# Patient Record
Sex: Female | Born: 1950 | Race: White | Hispanic: No | Smoking: Former smoker
Health system: Southern US, Community
[De-identification: ages and names within clinical notes are randomized; demographics above are authoritative.]

## PROBLEM LIST (undated history)

## (undated) DIAGNOSIS — I2699 Other pulmonary embolism without acute cor pulmonale: Secondary | ICD-10-CM

## (undated) DIAGNOSIS — M726 Necrotizing fasciitis: Secondary | ICD-10-CM

## (undated) DIAGNOSIS — D649 Anemia, unspecified: Secondary | ICD-10-CM

## (undated) DIAGNOSIS — J189 Pneumonia, unspecified organism: Secondary | ICD-10-CM

## (undated) DIAGNOSIS — I82409 Acute embolism and thrombosis of unspecified deep veins of unspecified lower extremity: Secondary | ICD-10-CM

## (undated) DIAGNOSIS — N189 Chronic kidney disease, unspecified: Secondary | ICD-10-CM

## (undated) DIAGNOSIS — I509 Heart failure, unspecified: Secondary | ICD-10-CM

## (undated) DIAGNOSIS — R0602 Shortness of breath: Secondary | ICD-10-CM

## (undated) DIAGNOSIS — I429 Cardiomyopathy, unspecified: Secondary | ICD-10-CM

## (undated) DIAGNOSIS — E079 Disorder of thyroid, unspecified: Secondary | ICD-10-CM

## (undated) DIAGNOSIS — I10 Essential (primary) hypertension: Secondary | ICD-10-CM

## (undated) DIAGNOSIS — I4891 Unspecified atrial fibrillation: Secondary | ICD-10-CM

## (undated) HISTORY — DX: Anemia, unspecified: D64.9

## (undated) HISTORY — DX: Heart failure, unspecified: I50.9

## (undated) HISTORY — DX: Chronic kidney disease, unspecified: N18.9

## (undated) HISTORY — DX: Essential (primary) hypertension: I10

## (undated) HISTORY — DX: Necrotizing fasciitis: M72.6

## (undated) HISTORY — DX: Disorder of thyroid, unspecified: E07.9

## (undated) HISTORY — DX: Unspecified atrial fibrillation: I48.91

## (undated) HISTORY — DX: Cardiomyopathy, unspecified: I42.9

## (undated) HISTORY — PX: SALIVARY GLAND SURGERY: SHX768

---

## 2008-01-09 ENCOUNTER — Emergency Department: Payer: Self-pay | Admitting: Emergency Medicine

## 2008-01-11 ENCOUNTER — Emergency Department: Payer: Self-pay | Admitting: Emergency Medicine

## 2008-01-19 ENCOUNTER — Emergency Department: Payer: Self-pay | Admitting: Emergency Medicine

## 2008-01-31 ENCOUNTER — Emergency Department: Payer: Self-pay

## 2008-05-31 DIAGNOSIS — J189 Pneumonia, unspecified organism: Secondary | ICD-10-CM

## 2008-05-31 HISTORY — DX: Pneumonia, unspecified organism: J18.9

## 2009-08-14 ENCOUNTER — Inpatient Hospital Stay: Payer: Self-pay | Admitting: Internal Medicine

## 2010-05-31 DIAGNOSIS — M726 Necrotizing fasciitis: Secondary | ICD-10-CM

## 2010-05-31 HISTORY — DX: Necrotizing fasciitis: M72.6

## 2010-06-26 ENCOUNTER — Inpatient Hospital Stay: Payer: Self-pay | Admitting: Internal Medicine

## 2010-10-30 HISTORY — PX: BACK SURGERY: SHX140

## 2011-02-10 HISTORY — PX: AV FISTULA PLACEMENT: SHX1204

## 2011-05-07 ENCOUNTER — Other Ambulatory Visit: Payer: Self-pay

## 2011-05-07 DIAGNOSIS — T82898A Other specified complication of vascular prosthetic devices, implants and grafts, initial encounter: Secondary | ICD-10-CM

## 2011-06-11 ENCOUNTER — Encounter: Payer: Self-pay | Admitting: Vascular Surgery

## 2011-06-15 ENCOUNTER — Encounter: Payer: Self-pay | Admitting: Vascular Surgery

## 2011-06-16 ENCOUNTER — Ambulatory Visit (INDEPENDENT_AMBULATORY_CARE_PROVIDER_SITE_OTHER): Payer: Medicare Other | Admitting: Vascular Surgery

## 2011-06-16 ENCOUNTER — Other Ambulatory Visit (INDEPENDENT_AMBULATORY_CARE_PROVIDER_SITE_OTHER): Payer: Medicare Other | Admitting: *Deleted

## 2011-06-16 ENCOUNTER — Encounter: Payer: Self-pay | Admitting: Vascular Surgery

## 2011-06-16 VITALS — BP 173/66 | HR 73 | Resp 16 | Ht 64.0 in | Wt 214.0 lb

## 2011-06-16 DIAGNOSIS — N186 End stage renal disease: Secondary | ICD-10-CM | POA: Insufficient documentation

## 2011-06-16 DIAGNOSIS — T82898A Other specified complication of vascular prosthetic devices, implants and grafts, initial encounter: Secondary | ICD-10-CM

## 2011-06-16 NOTE — Progress Notes (Signed)
Vascular and Vein Specialist of [facility]  Patient name: Kelly Gallegos MRN: [id number] DOB: 1950/10/31 Sex: female  REASON FOR CONSULT: evaluate nonfunctioning left AV fistula. Referred by Dr. Darrick Penna.  HPI: Kelly Gallegos is a 61 y.o. female with stage V chronic kidney disease who dialyzes on Tuesdays Thursdays and Saturdays. She had a left brachiocephalic AV fistula placed in [address] in September of 2012. She was referred for with a nonfunctioning fistula. She is currently dialyzed via a Diatek catheter. She's had no recent uremic symptoms. Specifically she denies nausea, vomiting, fatigue, or anorexia.  Past Medical History  Diagnosis Date  . Diabetes mellitus     Type II  . Hypertension   . Atrial fibrillation   . Anemia     Anemia of chronic disease  . Thyroid disease     Secondary Hyperparathyroidism  . Chronic kidney disease   . Necrotizing fasciitis 2012  . CHF (congestive heart failure)     History reviewed. No pertinent family history.  SOCIAL HISTORY: History  Substance Use Topics  . Smoking status: Former Smoker    Quit date: 05/31/2010  . Smokeless tobacco: Not on file  . Alcohol Use: No    Allergies  Allergen Reactions  . Sulfa Antibiotics     Current Outpatient Prescriptions  Medication Sig Dispense Refill  . B Complex-C-Folic Acid (DIALYVITE PO) Take by mouth.      . calcium carbonate (TUMS - DOSED IN MG ELEMENTAL CALCIUM) 500 MG chewable tablet Chew 1 tablet by mouth daily.      . digoxin (LANOXIN) 0.125 MG tablet Take 125 mcg by mouth daily.      Marland Kitchen glipiZIDE (GLUCOTROL XL) 5 MG 24 hr tablet Take 5 mg by mouth daily.      . metoprolol succinate (TOPROL-XL) 25 MG 24 hr tablet Take 25 mg by mouth daily.        REVIEW OF SYSTEMS: Arly.Keller ] denotes positive finding; [  ] denotes negative finding  CARDIOVASCULAR:  [ ]  chest pain   [ ]  chest pressure   [ ]  palpitations   [ ]  orthopnea   [ ]  dyspnea on exertion   [ ]  claudication   [ ]  rest pain   [ ]   DVT   [ ]  phlebitis PULMONARY:   [ ]  productive cough   [ ]  asthma   [ ]  wheezing NEUROLOGIC:   [ ]  weakness  [ ]  paresthesias  [ ]  aphasia  [ ]  amaurosis  [ ]  dizziness HEMATOLOGIC:   [ ]  bleeding problems   [ ]  clotting disorders MUSCULOSKELETAL:  [ ]  joint pain   [ ]  joint swelling [ ]  leg swelling GASTROINTESTINAL: [ ]   blood in stool  [ ]   hematemesis GENITOURINARY:  [ ]   dysuria  [ ]   hematuria PSYCHIATRIC:  [ ]  history of major depression INTEGUMENTARY:  [ ]  rashes  [ ]  ulcers CONSTITUTIONAL:  [ ]  fever   [ ]  chills  PHYSICAL EXAM: Filed Vitals:   06/16/11 1506  BP: 173/66  Pulse: 73  Resp: 16  Height: 5\' 4"  (1.626 m)  Weight: 214 lb (97.07 kg)   Body mass index is 36.73 kg/(m^2). GENERAL: The patient is a well-nourished female, in no acute distress. The vital signs are documented above. CARDIOVASCULAR: There is a regular rate and rhythm without significant murmur appreciated. I do not detect carotid bruits. She has palpable radial and brachial pulses bilaterally. PULMONARY: There is good air exchange bilaterally without wheezing or  rales. ABDOMEN: Soft and non-tender with normal pitched bowel sounds.  MUSCULOSKELETAL: There are no major deformities or cyanosis. SKIN: There are no ulcers or rashes noted. PSYCHIATRIC: The patient has a normal affect.  DATA:  I have independently interpreted her duplex of her fistula. This shows she has an area of decreased vessel size in the proximal fistula with velocities up to 777 cm/s. Fistula is patent. Ammeters range from 0.21 cm to 0.48 cm. The fistula appears to be a right reasonable depth proximally covering the mid brachium is 7 mm in depth.  I did review the operative report from [facility]. The vein was felt to be reasonable size at the time of surgery to  Did independently and duplex the basilic vein in the left arm myself in the office today and this does appear to be fairly small.  MEDICAL ISSUES: I've recommended we proceed  with a fistulogram and hopefully she has a stenosis proximally but could be addressed with venoplasty. The fistula cannot be salvaged been she could be considered for a basilic vein dense position on the left although the vein is fairly small. We could consider doing this in 2 stages. I have discussed the indications for a fistulogram and the potential complications including bleeding or injury to the fistula. All of her questions were answered she is agreeable to proceed as a transportation issues she could not schedule this until 07/05/2011. As a nondialysis day.   Jaycob Mcclenton S [facility] and Vein Specialists of [place] Beeper: [phone]

## 2011-06-22 ENCOUNTER — Other Ambulatory Visit: Payer: Self-pay

## 2011-06-22 ENCOUNTER — Other Ambulatory Visit: Payer: Medicaid Other

## 2011-06-22 ENCOUNTER — Ambulatory Visit: Payer: Medicaid Other | Admitting: Vascular Surgery

## 2011-06-23 NOTE — Procedures (Unsigned)
VASCULAR LAB EXAM  INDICATION:  Nonmaturing AV fistula.  HISTORY: Diabetes: Cardiac: Hypertension:  EXAM:  Left arm AV fistula duplex.  IMPRESSION: 1. Patent left brachiocephalic AV fistula with a velocity of 777 cm/s     noted at the distal brachium level outflow vein.  This increase in     velocity appears to be due to a decreased vessel size. 2. The left radial artery flow appears antegrade. 3. Depth, diameter, velocities, and patent outflow vein branches are     noted on the attached worksheet.  ___________________________________________ Di Kindle. Edilia Bo, M.D.  CH/MEDQ  D:  06/17/2011  T:  06/17/2011  Job:  [id number]

## 2011-07-01 ENCOUNTER — Encounter (HOSPITAL_COMMUNITY): Payer: Self-pay

## 2011-07-04 MED ORDER — SODIUM CHLORIDE 0.9 % IJ SOLN: 3.0000 mL | INTRAMUSCULAR | Status: DC | PRN

## 2011-07-05 ENCOUNTER — Other Ambulatory Visit: Payer: Self-pay

## 2011-07-05 ENCOUNTER — Ambulatory Visit (HOSPITAL_COMMUNITY)
Admission: RE | Admit: 2011-07-05 | Discharge: 2011-07-05 | Disposition: A | Discharge status: Home / Self Care | Source: Ambulatory Visit | Attending: Vascular Surgery | Admitting: Vascular Surgery

## 2011-07-05 ENCOUNTER — Encounter (HOSPITAL_COMMUNITY)
Admission: RE | Disposition: A | Discharge status: Home / Self Care | Payer: Self-pay | Source: Ambulatory Visit | Attending: Vascular Surgery

## 2011-07-05 DIAGNOSIS — T82898A Other specified complication of vascular prosthetic devices, implants and grafts, initial encounter: Secondary | ICD-10-CM

## 2011-07-05 DIAGNOSIS — Y832 Surgical operation with anastomosis, bypass or graft as the cause of abnormal reaction of the patient, or of later complication, without mention of misadventure at the time of the procedure: Secondary | ICD-10-CM | POA: Insufficient documentation

## 2011-07-05 DIAGNOSIS — N189 Chronic kidney disease, unspecified: Secondary | ICD-10-CM | POA: Insufficient documentation

## 2011-07-05 DIAGNOSIS — D638 Anemia in other chronic diseases classified elsewhere: Secondary | ICD-10-CM | POA: Insufficient documentation

## 2011-07-05 DIAGNOSIS — I509 Heart failure, unspecified: Secondary | ICD-10-CM | POA: Insufficient documentation

## 2011-07-05 DIAGNOSIS — N2581 Secondary hyperparathyroidism of renal origin: Secondary | ICD-10-CM | POA: Insufficient documentation

## 2011-07-05 DIAGNOSIS — I4891 Unspecified atrial fibrillation: Secondary | ICD-10-CM | POA: Insufficient documentation

## 2011-07-05 DIAGNOSIS — I129 Hypertensive chronic kidney disease with stage 1 through stage 4 chronic kidney disease, or unspecified chronic kidney disease: Secondary | ICD-10-CM | POA: Insufficient documentation

## 2011-07-05 DIAGNOSIS — E119 Type 2 diabetes mellitus without complications: Secondary | ICD-10-CM | POA: Insufficient documentation

## 2011-07-05 DIAGNOSIS — N186 End stage renal disease: Secondary | ICD-10-CM

## 2011-07-05 HISTORY — PX: FISTULOGRAM: SHX5832

## 2011-07-05 LAB — GLUCOSE, CAPILLARY: Glucose-Capillary: 201 mg/dL — ABNORMAL HIGH (ref 70–99)

## 2011-07-05 SURGERY — FISTULOGRAM
Anesthesia: LOCAL | Laterality: Left

## 2011-07-05 MED ORDER — HEPARIN (PORCINE) IN NACL 2-0.9 UNIT/ML-% IJ SOLN
INTRAMUSCULAR | Status: AC
  Filled 2011-07-05: qty 1000

## 2011-07-05 MED ORDER — LIDOCAINE HCL (PF) 1 % IJ SOLN
INTRAMUSCULAR | Status: AC
  Filled 2011-07-05: qty 30

## 2011-07-05 NOTE — H&P (View-Only) (Signed)
Vascular and Vein Specialist of [place]  Patient name: Kelly Gallegos MRN: [id number] DOB: 62/12/1950 Sex: female  REASON FOR CONSULT: evaluate nonfunctioning left AV fistula. Referred by Dr. Deterding.  HPI: Kelly Gallegos is a 61 y.o. female with stage V chronic kidney disease who dialyzes on Tuesdays Thursdays and Saturdays. She had a left brachiocephalic AV fistula placed in [place] in September of 2012. She was referred for with a nonfunctioning fistula. She is currently dialyzed via a Diatek catheter. She's had no recent uremic symptoms. Specifically she denies nausea, vomiting, fatigue, or anorexia.  Past Medical History  Diagnosis Date  . Diabetes mellitus     Type II  . Hypertension   . Atrial fibrillation   . Anemia     Anemia of chronic disease  . Thyroid disease     Secondary Hyperparathyroidism  . Chronic kidney disease   . Necrotizing fasciitis 2012  . CHF (congestive heart failure)     History reviewed. No pertinent family history.  SOCIAL HISTORY: History  Substance Use Topics  . Smoking status: Former Smoker    Quit date: 05/31/2010  . Smokeless tobacco: Not on file  . Alcohol Use: No    Allergies  Allergen Reactions  . Sulfa Antibiotics     Current Outpatient Prescriptions  Medication Sig Dispense Refill  . B Complex-C-Folic Acid (DIALYVITE PO) Take by mouth.      . calcium carbonate (TUMS - DOSED IN MG ELEMENTAL CALCIUM) 500 MG chewable tablet Chew 1 tablet by mouth daily.      . digoxin (LANOXIN) 0.125 MG tablet Take 125 mcg by mouth daily.      . glipiZIDE (GLUCOTROL XL) 5 MG 24 hr tablet Take 5 mg by mouth daily.      . metoprolol succinate (TOPROL-XL) 25 MG 24 hr tablet Take 25 mg by mouth daily.        REVIEW OF SYSTEMS: [X ] denotes positive finding; [  ] denotes negative finding  CARDIOVASCULAR:  [ ] chest pain   [ ] chest pressure   [ ] palpitations   [ ] orthopnea   [ ] dyspnea on exertion   [ ] claudication   [ ] rest pain   [ ]  DVT   [ ] phlebitis PULMONARY:   [ ] productive cough   [ ] asthma   [ ] wheezing NEUROLOGIC:   [ ] weakness  [ ] paresthesias  [ ] aphasia  [ ] amaurosis  [ ] dizziness HEMATOLOGIC:   [ ] bleeding problems   [ ] clotting disorders MUSCULOSKELETAL:  [ ] joint pain   [ ] joint swelling [ ] leg swelling GASTROINTESTINAL: [ ]  blood in stool  [ ]  hematemesis GENITOURINARY:  [ ]  dysuria  [ ]  hematuria PSYCHIATRIC:  [ ] history of major depression INTEGUMENTARY:  [ ] rashes  [ ] ulcers CONSTITUTIONAL:  [ ] fever   [ ] chills  PHYSICAL EXAM: Filed Vitals:   06/16/11 1506  BP: 173/66  Pulse: 73  Resp: 16  Height: 5' 4" (1.626 m)  Weight: 214 lb (97.07 kg)   Body mass index is 36.73 kg/(m^2). GENERAL: The patient is a well-nourished female, in no acute distress. The vital signs are documented above. CARDIOVASCULAR: There is a regular rate and rhythm without significant murmur appreciated. I do not detect carotid bruits. She has palpable radial and brachial pulses bilaterally. PULMONARY: There is good air exchange bilaterally without wheezing or   rales. ABDOMEN: Soft and non-tender with normal pitched bowel sounds.  MUSCULOSKELETAL: There are no major deformities or cyanosis. SKIN: There are no ulcers or rashes noted. PSYCHIATRIC: The patient has a normal affect.  DATA:  I have independently interpreted her duplex of her fistula. This shows she has an area of decreased vessel size in the proximal fistula with velocities up to 777 cm/s. Fistula is patent. Ammeters range from 0.21 cm to 0.48 cm. The fistula appears to be a right reasonable depth proximally covering the mid brachium is 7 mm in depth.  I did review the operative report from [place]. The vein was felt to be reasonable size at the time of surgery to  Did independently and duplex the basilic vein in the left arm myself in the office today and this does appear to be fairly small.  MEDICAL ISSUES: I've recommended we proceed  with a fistulogram and hopefully she has a stenosis proximally but could be addressed with venoplasty. The fistula cannot be salvaged been she could be considered for a basilic vein dense position on the left although the vein is fairly small. We could consider doing this in 2 stages. I have discussed the indications for a fistulogram and the potential complications including bleeding or injury to the fistula. All of her questions were answered she is agreeable to proceed as a transportation issues she could not schedule this until 07/05/2011. As a nondialysis day.   Kelly Gallegos S V[facility] and Vein Specialists of [place] Beeper: [phone]    

## 2011-07-05 NOTE — Op Note (Signed)
PATIENTBlia Totman   MRN: [id number] DOB: 22-Dec-1950    DATE OF PROCEDURE: 07/05/2011  INDICATIONS: Kelly Gallegos is a 61 y.o. female who had a left brachiocephalic AV fistula placed elsewhere. Duplex suggested a stenosis in the proximal fistula she comes in for a fistulogram.  PROCEDURE:  1. Ultrasound-guided access to left AV fistula. 2. fistulogram left brachiocephalic AV fistula.  SURGEON: Di Kindle. Edilia Bo, MD, FACS  ANESTHESIA: local   EBL: minimal  TECHNIQUE: the patient was brought to the PV lab in the left upper extremity was prepped and draped in the usual sterile fashion. Under ultrasound guidance and after the skin was anesthetized the vein which was very small was cannulated with a micropuncture needle. The wire was introduced I had difficulty in passing the wire because of the small size of the vein. It took multiple attempts before I was able to successfully place the micro-sheath with good return vitals ultimately successful. Fistulogram was then obtained. I was able to visualize the proximal fistula without compressing the distal fistula as I advanced the sheath over a wire into the proximal cephalic vein. At the completion of the procedure the sheath was removed after that a pursestring suture was placed around the entrance site with a Monocryl suture.  FINDINGS: the fistula his widely patent. There is an area of diffuse moderate narrowing throughout the proximal fistula over a length of approximately 3-4 cm. There is one competing branches above this area.  I have recommended surgical revision of the proximal fistula which will be scheduled for a nondialysis day.  Waverly Ferrari, MD, FACS Vascular and Vein Specialists of [facility]  DATE OF DICTATION:   07/05/2011

## 2011-07-05 NOTE — Interval H&P Note (Signed)
History and Physical Interval Note:  07/05/2011 10:05 AM  Kelly Gallegos  has presented today for surgery, with the diagnosis of instage renal disease  The various methods of treatment have been discussed with the patient and family. After consideration of risks, benefits and other options for treatment, the patient has consented to  Procedure(s): FISTULOGRAM  POSSIBLE ANGIOPLASTY.  The patients' history has been reviewed, patient examined, no change in status, stable for surgery.  I have reviewed the patients' chart and labs.  Questions were answered to the patient's satisfaction.     [name]

## 2011-07-06 LAB — POCT I-STAT, CHEM 8
Calcium, Ion: 1.19 mmol/L (ref 1.12–1.32)
HCT: 40 % (ref 36.0–46.0)
Hemoglobin: 13.6 g/dL (ref 12.0–15.0)
Sodium: 137 mEq/L (ref 135–145)
TCO2: 28 mmol/L (ref 0–100)

## 2011-07-15 ENCOUNTER — Other Ambulatory Visit: Payer: Self-pay | Admitting: Family Medicine

## 2011-07-15 ENCOUNTER — Other Ambulatory Visit: Payer: Self-pay

## 2011-07-15 DIAGNOSIS — Z1231 Encounter for screening mammogram for malignant neoplasm of breast: Secondary | ICD-10-CM

## 2011-07-20 ENCOUNTER — Encounter (HOSPITAL_COMMUNITY): Payer: Self-pay | Admitting: Pharmacy Technician

## 2011-07-22 ENCOUNTER — Other Ambulatory Visit: Payer: Self-pay | Admitting: *Deleted

## 2011-07-22 ENCOUNTER — Encounter (HOSPITAL_COMMUNITY): Payer: Self-pay | Admitting: *Deleted

## 2011-07-22 MED ORDER — SODIUM CHLORIDE 0.9 % IV SOLN: INTRAVENOUS | Status: DC

## 2011-07-22 MED ORDER — CEFAZOLIN SODIUM 1-5 GM-% IV SOLN: 1.0000 g | Freq: Once | INTRAVENOUS | Status: DC

## 2011-07-23 ENCOUNTER — Encounter (HOSPITAL_COMMUNITY): Payer: Self-pay

## 2011-07-23 ENCOUNTER — Encounter (HOSPITAL_COMMUNITY): Payer: Self-pay | Admitting: Certified Registered"

## 2011-07-23 ENCOUNTER — Ambulatory Visit (HOSPITAL_COMMUNITY)

## 2011-07-23 ENCOUNTER — Ambulatory Visit (HOSPITAL_COMMUNITY)
Admission: RE | Admit: 2011-07-23 | Discharge: 2011-07-23 | Disposition: A | Discharge status: Home / Self Care | Source: Ambulatory Visit | Attending: Vascular Surgery | Admitting: Vascular Surgery

## 2011-07-23 ENCOUNTER — Ambulatory Visit (HOSPITAL_COMMUNITY): Admitting: Certified Registered"

## 2011-07-23 ENCOUNTER — Encounter (HOSPITAL_COMMUNITY)
Admission: RE | Disposition: A | Discharge status: Home / Self Care | Payer: Self-pay | Source: Ambulatory Visit | Attending: Vascular Surgery

## 2011-07-23 DIAGNOSIS — T82898A Other specified complication of vascular prosthetic devices, implants and grafts, initial encounter: Secondary | ICD-10-CM

## 2011-07-23 DIAGNOSIS — I129 Hypertensive chronic kidney disease with stage 1 through stage 4 chronic kidney disease, or unspecified chronic kidney disease: Secondary | ICD-10-CM | POA: Insufficient documentation

## 2011-07-23 DIAGNOSIS — I4891 Unspecified atrial fibrillation: Secondary | ICD-10-CM | POA: Insufficient documentation

## 2011-07-23 DIAGNOSIS — E119 Type 2 diabetes mellitus without complications: Secondary | ICD-10-CM | POA: Insufficient documentation

## 2011-07-23 DIAGNOSIS — N189 Chronic kidney disease, unspecified: Secondary | ICD-10-CM | POA: Insufficient documentation

## 2011-07-23 DIAGNOSIS — N186 End stage renal disease: Secondary | ICD-10-CM

## 2011-07-23 DIAGNOSIS — D631 Anemia in chronic kidney disease: Secondary | ICD-10-CM | POA: Insufficient documentation

## 2011-07-23 DIAGNOSIS — I509 Heart failure, unspecified: Secondary | ICD-10-CM | POA: Insufficient documentation

## 2011-07-23 DIAGNOSIS — Y849 Medical procedure, unspecified as the cause of abnormal reaction of the patient, or of later complication, without mention of misadventure at the time of the procedure: Secondary | ICD-10-CM | POA: Insufficient documentation

## 2011-07-23 HISTORY — PX: OTHER SURGICAL HISTORY: SHX169

## 2011-07-23 LAB — POCT I-STAT 4, (NA,K, GLUC, HGB,HCT)
Hemoglobin: 12.9 g/dL (ref 12.0–15.0)
Potassium: 3.1 mEq/L — ABNORMAL LOW (ref 3.5–5.1)
Sodium: 141 mEq/L (ref 135–145)

## 2011-07-23 LAB — SURGICAL PCR SCREEN
MRSA, PCR: NEGATIVE
Staphylococcus aureus: NEGATIVE

## 2011-07-23 LAB — GLUCOSE, CAPILLARY: Glucose-Capillary: 189 mg/dL — ABNORMAL HIGH (ref 70–99)

## 2011-07-23 SURGERY — REVISON OF ARTERIOVENOUS FISTULA
Anesthesia: Monitor Anesthesia Care | Site: Arm Upper | Laterality: Left | Wound class: Clean

## 2011-07-23 MED ORDER — OXYCODONE HCL 5 MG PO TABS: 5.0000 mg | ORAL_TABLET | Freq: Four times a day (QID) | ORAL | Status: AC | PRN

## 2011-07-23 MED ORDER — SODIUM CHLORIDE 0.9 % IR SOLN
Status: DC | PRN
  Administered 2011-07-23: 1000 mL

## 2011-07-23 MED ORDER — HEPARIN SODIUM (PORCINE) 1000 UNIT/ML IJ SOLN
1000.0000 [IU] | Freq: Once | INTRAMUSCULAR | Status: AC
  Administered 2011-07-23: 1900 [IU] via INTRAVENOUS

## 2011-07-23 MED ORDER — FENTANYL CITRATE 0.05 MG/ML IJ SOLN
25.0000 ug | INTRAMUSCULAR | Status: DC | PRN
  Administered 2011-07-23: 25 ug via INTRAVENOUS

## 2011-07-23 MED ORDER — DROPERIDOL 2.5 MG/ML IJ SOLN
INTRAMUSCULAR | Status: DC | PRN
  Administered 2011-07-23: 0.625 mg via INTRAVENOUS

## 2011-07-23 MED ORDER — PROTAMINE SULFATE 10 MG/ML IV SOLN
INTRAVENOUS | Status: DC | PRN
  Administered 2011-07-23: 30 mg via INTRAVENOUS

## 2011-07-23 MED ORDER — PROMETHAZINE HCL 25 MG/ML IJ SOLN: 6.2500 mg | INTRAMUSCULAR | Status: DC | PRN

## 2011-07-23 MED ORDER — SODIUM CHLORIDE 0.9 % IR SOLN
Status: DC | PRN
  Administered 2011-07-23: 08:00:00

## 2011-07-23 MED ORDER — MUPIROCIN 2 % EX OINT
TOPICAL_OINTMENT | CUTANEOUS | Status: AC
  Filled 2011-07-23: qty 22

## 2011-07-23 MED ORDER — SODIUM CHLORIDE 0.9 % IV SOLN
INTRAVENOUS | Status: DC | PRN
  Administered 2011-07-23: 07:00:00 via INTRAVENOUS

## 2011-07-23 MED ORDER — LIDOCAINE HCL (CARDIAC) 20 MG/ML IV SOLN
INTRAVENOUS | Status: DC | PRN
  Administered 2011-07-23: 100 mg via INTRAVENOUS

## 2011-07-23 MED ORDER — HEPARIN SODIUM (PORCINE) 1000 UNIT/ML IJ SOLN
INTRAMUSCULAR | Status: DC | PRN
  Administered 2011-07-23: 6000 [IU] via INTRAVENOUS

## 2011-07-23 MED ORDER — PROPOFOL 10 MG/ML IV EMUL
INTRAVENOUS | Status: DC | PRN
  Administered 2011-07-23: 100 ug/kg/min via INTRAVENOUS

## 2011-07-23 MED ORDER — MIDAZOLAM HCL 5 MG/5ML IJ SOLN
INTRAMUSCULAR | Status: DC | PRN
  Administered 2011-07-23: 2 mg via INTRAVENOUS

## 2011-07-23 MED ORDER — LIDOCAINE HCL (PF) 1 % IJ SOLN
INTRAMUSCULAR | Status: DC | PRN
  Administered 2011-07-23: 8 mL

## 2011-07-23 MED ORDER — ONDANSETRON HCL 4 MG/2ML IJ SOLN
INTRAMUSCULAR | Status: DC | PRN
  Administered 2011-07-23: 4 mg via INTRAVENOUS

## 2011-07-23 MED ORDER — CEFAZOLIN SODIUM-DEXTROSE 2-3 GM-% IV SOLR
INTRAVENOUS | Status: AC
  Administered 2011-07-23: 2 g via INTRAVENOUS
  Filled 2011-07-23: qty 50

## 2011-07-23 SURGICAL SUPPLY — 37 items
CANISTER SUCTION 2500CC (MISCELLANEOUS) ×2 IMPLANT
CLIP TI MEDIUM 6 (CLIP) ×2 IMPLANT
CLIP TI WIDE RED SMALL 6 (CLIP) ×2 IMPLANT
CLOTH BEACON ORANGE TIMEOUT ST (SAFETY) ×2 IMPLANT
COVER PROBE W GEL 5X96 (DRAPES) IMPLANT
COVER SURGICAL LIGHT HANDLE (MISCELLANEOUS) ×4 IMPLANT
DECANTER SPIKE VIAL GLASS SM (MISCELLANEOUS) IMPLANT
DERMABOND ADVANCED (GAUZE/BANDAGES/DRESSINGS) ×1
DERMABOND ADVANCED .7 DNX12 (GAUZE/BANDAGES/DRESSINGS) ×1 IMPLANT
DRAIN PENROSE 1/2X12 LTX STRL (WOUND CARE) IMPLANT
ELECT REM PT RETURN 9FT ADLT (ELECTROSURGICAL) ×2
ELECTRODE REM PT RTRN 9FT ADLT (ELECTROSURGICAL) ×1 IMPLANT
GEL ULTRASOUND 20GR AQUASONIC (MISCELLANEOUS) ×2 IMPLANT
GLOVE BIO SURGEON STRL SZ7.5 (GLOVE) ×2 IMPLANT
GLOVE BIOGEL PI IND STRL 7.5 (GLOVE) ×4 IMPLANT
GLOVE BIOGEL PI INDICATOR 7.5 (GLOVE) ×4
GLOVE SS BIOGEL STRL SZ 7 (GLOVE) ×1 IMPLANT
GLOVE SUPERSENSE BIOGEL SZ 7 (GLOVE) ×1
GLOVE SURG SS PI 7.5 STRL IVOR (GLOVE) ×2 IMPLANT
GOWN BRE IMP SLV AUR XL STRL (GOWN DISPOSABLE) ×2 IMPLANT
GOWN STRL NON-REIN LRG LVL3 (GOWN DISPOSABLE) ×4 IMPLANT
KIT BASIN OR (CUSTOM PROCEDURE TRAY) ×2 IMPLANT
KIT ROOM TURNOVER OR (KITS) ×2 IMPLANT
NS IRRIG 1000ML POUR BTL (IV SOLUTION) ×2 IMPLANT
PACK CV ACCESS (CUSTOM PROCEDURE TRAY) ×2 IMPLANT
PAD ARMBOARD 7.5X6 YLW CONV (MISCELLANEOUS) ×4 IMPLANT
PATCH VASCULAR VASCU GUARD 1X6 (Vascular Products) ×2 IMPLANT
SPONGE GAUZE 4X4 12PLY (GAUZE/BANDAGES/DRESSINGS) IMPLANT
SPONGE SURGIFOAM ABS GEL 100 (HEMOSTASIS) IMPLANT
SUT PROLENE 6 0 BV (SUTURE) ×2 IMPLANT
SUT VIC AB 3-0 SH 27 (SUTURE) ×1
SUT VIC AB 3-0 SH 27X BRD (SUTURE) ×1 IMPLANT
SUT VICRYL 4-0 PS2 18IN ABS (SUTURE) ×2 IMPLANT
TOWEL OR 17X24 6PK STRL BLUE (TOWEL DISPOSABLE) ×2 IMPLANT
TOWEL OR 17X26 10 PK STRL BLUE (TOWEL DISPOSABLE) ×2 IMPLANT
UNDERPAD 30X30 INCONTINENT (UNDERPADS AND DIAPERS) ×2 IMPLANT
WATER STERILE IRR 1000ML POUR (IV SOLUTION) ×2 IMPLANT

## 2011-07-23 NOTE — Transfer of Care (Signed)
Immediate Anesthesia Transfer of Care Note  Patient: Kelly Gallegos  Procedure(s) Performed: Procedure(s) (LRB): REVISON OF ARTERIOVENOUS FISTULA (Left) LIGATION OF COMPETING BRANCHES OF ARTERIOVENOUS FISTULA (Left)  Patient Location: PACU  Anesthesia Type: MAC  Level of Consciousness: awake, oriented and patient cooperative  Airway & Oxygen Therapy: Patient Spontanous Breathing  Post-op Assessment: Report given to PACU RN, Post -op Vital signs reviewed and stable and Patient moving all extremities  Post vital signs: Reviewed and stable  Complications: No apparent anesthesia complications

## 2011-07-23 NOTE — Progress Notes (Signed)
Daughter, Thea Silversmith, called & notified that we would be keeping pt until she gets off work@1600 .  She verbalized understanding.//L. LoveRN

## 2011-07-23 NOTE — Progress Notes (Signed)
HD blue port capped off. Flushed with 10cc NS with GBR. Flushed with Heparin 1.78ml (1000u/ml) per priming volume in the blue port.  Cap cleaned, dead end cap placed on the blue port, both clamps taped, red "high dose heparin" sticker placed on the catheter. Consuello Masse

## 2011-07-23 NOTE — Anesthesia Postprocedure Evaluation (Signed)
  Anesthesia Post-op Note  Patient: Kelly Gallegos  Procedure(s) Performed: Procedure(s) (LRB): REVISON OF ARTERIOVENOUS FISTULA (Left) LIGATION OF COMPETING BRANCHES OF ARTERIOVENOUS FISTULA (Left)  Patient Location: PACU  Anesthesia Type: MAC  Level of Consciousness: awake, alert  and oriented  Airway and Oxygen Therapy: Patient Spontanous Breathing and Patient connected to nasal cannula oxygen  Post-op Pain: none  Post-op Assessment: Post-op Vital signs reviewed, Patient's Cardiovascular Status Stable, Respiratory Function Stable, Patent Airway, No signs of Nausea or vomiting and Pain level controlled  Post-op Vital Signs: Reviewed and stable  Complications: No apparent anesthesia complications

## 2011-07-23 NOTE — H&P (Signed)
History and Physical Exam  Patient name: Kelly Gallegos  MRN: [id number]        DOB: 06-Jul-1950          Sex: female  HPI: Kelly Gallegos is a 61 y.o. female with stage V chronic kidney disease who dialyzes on Tuesdays Thursdays and Saturdays. She had a left brachiocephalic AV fistula placed in [place] in September of 2012. She was referred for with a nonfunctioning fistula. She is currently dialyzed via a Diatek catheter. She's had no recent uremic symptoms. Specifically she denies nausea, vomiting, fatigue, or anorexia.    Past Medical History   Diagnosis  Date   .  Diabetes mellitus         Type II   .  Hypertension     .  Atrial fibrillation     .  Anemia         Anemia of chronic disease   .  Thyroid disease         Secondary Hyperparathyroidism   .  Chronic kidney disease     .  Necrotizing fasciitis  2012   .  CHF (congestive heart failure)      History reviewed. No pertinent family history.  SOCIAL HISTORY: History   Substance Use Topics   .  Smoking status:  Former Smoker       Quit date:  05/31/2010   .  Smokeless tobacco:  Not on file   .  Alcohol Use:  No      Allergies   Allergen  Reactions   .  Sulfa Antibiotics      Current Outpatient Prescriptions   Medication  Sig  Dispense  Refill   .  B Complex-C-Folic Acid (DIALYVITE PO)  Take by mouth.         .  calcium carbonate (TUMS - DOSED IN MG ELEMENTAL CALCIUM) 500 MG chewable tablet  Chew 1 tablet by mouth daily.         .  digoxin (LANOXIN) 0.125 MG tablet  Take 125 mcg by mouth daily.         Marland Kitchen  glipiZIDE (GLUCOTROL XL) 5 MG 24 hr tablet  Take 5 mg by mouth daily.         .  metoprolol succinate (TOPROL-XL) 25 MG 24 hr tablet  Take 25 mg by mouth daily.            REVIEW OF SYSTEMS: Arly.Keller ] denotes positive finding; [  ] denotes negative finding   CARDIOVASCULAR:  [ ]  chest pain   [ ]  chest pressure   [ ]  palpitations   [ ]  orthopnea               [ ]  dyspnea on exertion   [ ]  claudication   [ ]  rest  pain   [ ]  DVT   [ ]  phlebitis PULMONARY:   [ ]  productive cough   [ ]  asthma   [ ]  wheezing NEUROLOGIC:   [ ]  weakness  [ ]  paresthesias  [ ]  aphasia  [ ]  amaurosis  [ ]  dizziness HEMATOLOGIC:   [ ]  bleeding problems   [ ]  clotting disorders MUSCULOSKELETAL:  [ ]  joint pain   [ ]  joint swelling [ ]  leg swelling GASTROINTESTINAL: [ ]   blood in stool  [ ]   hematemesis GENITOURINARY:  [ ]   dysuria  [ ]   hematuria PSYCHIATRIC:  [ ]  history of major depression INTEGUMENTARY:  [ ]  rashes  [ ]   ulcers CONSTITUTIONAL:  [ ]  fever   [ ]  chills   PHYSICAL EXAM: Filed Vitals:      Filed Vitals:   07/23/11 0634  BP: 161/75  Pulse: 74  Temp: 98.5 F (36.9 C)  Resp: 18    GENERAL: The patient is a well-nourished female, in no acute distress. The vital signs are documented above. CARDIOVASCULAR: There is a regular rate and rhythm without significant murmur appreciated. I do not detect carotid bruits. She has palpable radial and brachial pulses bilaterally. PULMONARY: There is good air exchange bilaterally without wheezing or rales. ABDOMEN: Soft and non-tender with normal pitched bowel sounds.   MUSCULOSKELETAL: There are no major deformities or cyanosis. SKIN: There are no ulcers or rashes noted. PSYCHIATRIC: The patient has a normal affect.   DATA:   I have independently interpreted her duplex of her fistula. This shows she has an area of decreased vessel size in the proximal fistula with velocities up to 777 cm/s. Fistula is patent. Ammeters range from 0.21 cm to 0.48 cm. The fistula appears to be a right reasonable depth proximally covering the mid brachium is 7 mm in depth.  I did review the operative report from [facility]. The vein was felt to be reasonable size at the time of surgery.  I did independently and duplex the basilic vein in the left arm myself in the office today and this does appear to be fairly small.   MEDICAL ISSUES: Fistulogram suggests narrowing in the proximal  fistula. I have recommended that this be revised surgically. She presents for elective revision of her left AVF.  [name] S [facility] of [place] Beeper: [phone]

## 2011-07-23 NOTE — Op Note (Signed)
NAME: Kelly Gallegos   MRN: [id number] DOB: Aug 23, 1950    DATE OF OPERATION: 07/23/2011  PREOP DIAGNOSIS: poorly maturing left brachiocephalic AV fistula  POSTOP DIAGNOSIS: same  PROCEDURE:  1. Revision of left brachiocephalic fistula with bovine pericardial patch angioplasty of proximal cephalic vein 2. Ligation of competing branch  SURGEON: Di Kindle. Edilia Bo, MD, FACS  ASSIST: Lianne Cure PA  ANESTHESIA: local with sedation   EBL: minimal  INDICATIONS: [name] is a 61 y.o. female had a left brachiocephalic AV fistula placed in [place]. This was not maturing adequately and fistulogram demonstrated a long segment stenosis in the proximal fistula. She was brought in for revision.  FINDINGS: marked intimal hyperplasia of proximal cephalic vein   TECHNIQUE: the patient was taken to the operating room and sedated by anesthesia. The left upper extremity was prepped and draped in the usual sterile fashion. A longitudinal incision was made over the proximal vein after the skin was anesthetized. The cephalic vein was dissected free down to where it was anastomosed to the brachial artery. The dissection was continued up to the competing branching just beyond this. The patient was then heparinized. The competing branches ligated and divided. Did not think there was adequate vein to use as a vein patch and therefore elected to use a bovine pericardial patch. The cephalic vein was clamped proximal and distal to the area of stenosis. The vein was then opened longitudinally. There was marked intimal hyperplasia. The bovine pericardial patch was cut to the appropriate width and then sewn using continuous 6-0 Prolene suture. At the completion the thrill in the fistula was much improved. Hemostasis was obtained in the wound. The wounds closed the deep layer of 3-0 Vicryl the skin closed with 4-0 Vicryl. Dermabond was applied. The patient tolerated the procedure well and was transferred  to recovery in stable condition. All needle and sponge counts were correct.  Waverly Ferrari, MD, FACS [facility] Vein Special[facility] of [facility]  DATE OF DICTATION:   07/23/2011

## 2011-07-23 NOTE — Progress Notes (Signed)
Pt received from PACU.  Alert & Oriented x4.  She stated that she calls for transportation and going home to be taken care of by 62y.o. Granddaughter. Daughter will be home to stay w/ pt @1600 .  Dr. Jean Rosenthal notified. She stated it would be Dr. Adele Dan call.  Dr. Edilia Bo notified.  Dr. Edilia Bo for pt to stay until daughter is home.  Pt notified.  Leandro Reasoner, Short Stay Manager, notified.  Will keep pt until daughter is home.//L. Alira Fretwell,RN

## 2011-07-23 NOTE — Anesthesia Preprocedure Evaluation (Addendum)
Anesthesia Evaluation  Patient identified by MRN, date of birth, ID band Patient awake    Reviewed: Allergy & Precautions, H&P , NPO status , Patient's Chart, lab work & pertinent test results  History of Anesthesia Complications Negative for: history of anesthetic complications  Airway Mallampati: I TM Distance: >3 FB Neck ROM: Full    Dental No notable dental hx. (+) Teeth Intact and Dental Advisory Given   Pulmonary neg pulmonary ROS, former smoker (quit 1 year) clear to auscultation  Pulmonary exam normal       Cardiovascular hypertension, Pt. on medications and Pt. on home beta blockers + dysrhythmias (not on blood thinners) Atrial Fibrillation Irregular Normal    Neuro/Psych Negative Neurological ROS  Negative Psych ROS   GI/Hepatic negative GI ROS, Neg liver ROS,   Endo/Other  Diabetes mellitus- (glu 234), Well Controlled, Oral Hypoglycemic Agents  Renal/GU CRF and DialysisRenal disease (K+ 3.1)  Genitourinary negative   Musculoskeletal negative musculoskeletal ROS (+)   Abdominal (+) obese,   Peds  Hematology negative hematology ROS (+)   Anesthesia Other Findings   Reproductive/Obstetrics negative OB ROS                          Anesthesia Physical Anesthesia Plan  ASA: III  Anesthesia Plan: MAC   Post-op Pain Management:    Induction: Intravenous  Airway Management Planned:   Additional Equipment:   Intra-op Plan:   Post-operative Plan:   Informed Consent: I have reviewed the patients History and Physical, chart, labs and discussed the procedure including the risks, benefits and alternatives for the proposed anesthesia with the patient or authorized representative who has indicated his/her understanding and acceptance.   Dental advisory given  Plan Discussed with: Anesthesiologist, Surgeon and CRNA  Anesthesia Plan Comments: (Plan routine monitors, MAC)        Anesthesia Quick Evaluation

## 2011-07-23 NOTE — Progress Notes (Signed)
Pt noted to have received 500 mls NS w/in 2 hour time. Pt. Denies shortness of breath.  Pt reports that her fluid allotment for the day is 40 oz.  Dr. Edilia Bo notified & no orders received.//l. Gaspard Isbell,rn

## 2011-07-30 ENCOUNTER — Ambulatory Visit
Admission: RE | Admit: 2011-07-30 | Discharge: 2011-07-30 | Disposition: A | Discharge status: Home / Self Care | Payer: No Typology Code available for payment source | Source: Ambulatory Visit | Attending: Family Medicine | Admitting: Family Medicine

## 2011-07-30 DIAGNOSIS — Z1231 Encounter for screening mammogram for malignant neoplasm of breast: Secondary | ICD-10-CM

## 2011-08-20 ENCOUNTER — Other Ambulatory Visit (HOSPITAL_COMMUNITY)
Admission: RE | Admit: 2011-08-20 | Discharge: 2011-08-20 | Disposition: A | Discharge status: Home / Self Care | Source: Ambulatory Visit | Attending: Obstetrics and Gynecology | Admitting: Obstetrics and Gynecology

## 2011-08-20 ENCOUNTER — Other Ambulatory Visit: Payer: Self-pay | Admitting: Obstetrics and Gynecology

## 2011-08-20 DIAGNOSIS — Z124 Encounter for screening for malignant neoplasm of cervix: Secondary | ICD-10-CM | POA: Insufficient documentation

## 2011-08-24 ENCOUNTER — Encounter: Payer: Self-pay | Admitting: Vascular Surgery

## 2011-08-25 ENCOUNTER — Encounter (INDEPENDENT_AMBULATORY_CARE_PROVIDER_SITE_OTHER): Payer: Medicare (Managed Care) | Admitting: *Deleted

## 2011-08-25 ENCOUNTER — Ambulatory Visit (INDEPENDENT_AMBULATORY_CARE_PROVIDER_SITE_OTHER): Payer: Medicare (Managed Care) | Admitting: Vascular Surgery

## 2011-08-25 ENCOUNTER — Encounter: Payer: Self-pay | Admitting: Vascular Surgery

## 2011-08-25 ENCOUNTER — Other Ambulatory Visit: Payer: Self-pay | Admitting: *Deleted

## 2011-08-25 VITALS — BP 118/81 | HR 113 | Resp 18 | Ht 64.0 in | Wt 177.0 lb

## 2011-08-25 DIAGNOSIS — N186 End stage renal disease: Secondary | ICD-10-CM

## 2011-08-25 DIAGNOSIS — Z01810 Encounter for preprocedural cardiovascular examination: Secondary | ICD-10-CM

## 2011-08-25 NOTE — Progress Notes (Signed)
Vascular and Vein Specialist of [facility]  Patient name: Kelly Gallegos MRN: [id number] DOB: [birth year] Sex: female  REASON FOR VISIT: follow up after revision of left brachiocephalic AV fistula.  HPI: Kelly Gallegos is a 61 y.o. female who had a left brachiocephalic AV fistula placed in [place]. This was not maturing adequately in a fistulogram demonstrated a long segment stenosis in the proximal fistula. On 07/23/2011, she underwent revision of her left brachiocephalic fistula with bovine pericardial patch angioplasty of the proximal cephalic vein. She also had ligation of competing branch. She comes in for a follow up visit. She's had no complaints referrable to the fistula and no significant pain.  Past Medical History  Diagnosis Date  . Diabetes mellitus     Type II  . Hypertension   . Atrial fibrillation   . Anemia     Anemia of chronic disease  . Thyroid disease     Secondary Hyperparathyroidism  . Necrotizing fasciitis 2012  . CHF (congestive heart failure)   . Chronic kidney disease     T TH [facility] [address]   SOCIAL HISTORY: History  Substance Use Topics  . Smoking status: Former Smoker    Quit date: 05/31/2010  . Smokeless tobacco: Not on file  . Alcohol Use: No   REVIEW OF SYSTEMS: Arly.Keller ] denotes positive finding; [  ] denotes negative finding  CARDIOVASCULAR:  [ ]  chest pain   [ ]  chest pressure   [ ]  palpitations   [ ]  orthopnea  CONSTITUTIONAL:  [ ]  fever   [ ]  chills  PHYSICAL EXAM: Filed Vitals:   08/25/11 1514  BP: 118/81  Pulse: 113  Resp: 18  Height: 5\' 4"  (1.626 m)  Weight: 177 lb (80.287 kg)   Body mass index is 30.38 kg/(m^2). GENERAL: The patient is a well-nourished female, in no acute distress. The vital signs are documented above. CARDIOVASCULAR: There is a regular rate and rhythm  Her left upper arm fistula has a reasonable thrill although it is still not especially strong. As a palpable left radial pulse.  DATA:  Duplex of her fistula  it shows that there are elevated velocities in the proximal fistula but this is not a focal stenosis and is likely the area that was patched where there was significant intimal hyperplasia. The vein itself is somewhat small with diameters ranging from 0.2-0.48 cm.  We also obtained a vein mapping of her left basilic vein which shows a reasonably sized upper arm basilic vein on the left. Diameters range from 0.25-0.47 cm.  MEDICAL ISSUES: I think it would be reasonable to attempt to use her left brachiocephalic fistula. If this is not functional I do not see any other ways to revise her fistula. Think the next best option would be to attempt a basilic vein transposition on the left. In the meantime she does have a functioning catheter. She'll let us know if the fistula is not functional.  Suraiya Dickerson S [facility] [place]: [phone]

## 2011-09-06 ENCOUNTER — Encounter (HOSPITAL_COMMUNITY): Payer: Self-pay | Admitting: Emergency Medicine

## 2011-09-06 ENCOUNTER — Other Ambulatory Visit: Payer: Self-pay

## 2011-09-06 ENCOUNTER — Emergency Department (HOSPITAL_COMMUNITY): Payer: Medicare (Managed Care)

## 2011-09-06 ENCOUNTER — Inpatient Hospital Stay (HOSPITAL_COMMUNITY)
Admission: EM | Admit: 2011-09-06 | Discharge: 2011-09-08 | DRG: 154 | Disposition: A | Discharge status: Home / Self Care | Payer: Medicare (Managed Care) | Attending: Internal Medicine | Admitting: Internal Medicine

## 2011-09-06 ENCOUNTER — Inpatient Hospital Stay (HOSPITAL_COMMUNITY): Payer: Medicare (Managed Care)

## 2011-09-06 DIAGNOSIS — L03221 Cellulitis of neck: Secondary | ICD-10-CM

## 2011-09-06 DIAGNOSIS — N186 End stage renal disease: Secondary | ICD-10-CM | POA: Diagnosis present

## 2011-09-06 DIAGNOSIS — I482 Chronic atrial fibrillation, unspecified: Secondary | ICD-10-CM | POA: Diagnosis present

## 2011-09-06 DIAGNOSIS — K122 Cellulitis and abscess of mouth: Secondary | ICD-10-CM | POA: Diagnosis present

## 2011-09-06 DIAGNOSIS — Z6830 Body mass index (BMI) 30.0-30.9, adult: Secondary | ICD-10-CM

## 2011-09-06 DIAGNOSIS — L0211 Cutaneous abscess of neck: Secondary | ICD-10-CM | POA: Diagnosis present

## 2011-09-06 DIAGNOSIS — D638 Anemia in other chronic diseases classified elsewhere: Secondary | ICD-10-CM | POA: Diagnosis present

## 2011-09-06 DIAGNOSIS — I4891 Unspecified atrial fibrillation: Secondary | ICD-10-CM

## 2011-09-06 DIAGNOSIS — E119 Type 2 diabetes mellitus without complications: Secondary | ICD-10-CM | POA: Diagnosis present

## 2011-09-06 DIAGNOSIS — Z79899 Other long term (current) drug therapy: Secondary | ICD-10-CM

## 2011-09-06 DIAGNOSIS — N2581 Secondary hyperparathyroidism of renal origin: Secondary | ICD-10-CM | POA: Diagnosis present

## 2011-09-06 DIAGNOSIS — Z882 Allergy status to sulfonamides status: Secondary | ICD-10-CM

## 2011-09-06 DIAGNOSIS — I509 Heart failure, unspecified: Secondary | ICD-10-CM | POA: Diagnosis present

## 2011-09-06 DIAGNOSIS — K1120 Sialoadenitis, unspecified: Principal | ICD-10-CM | POA: Diagnosis present

## 2011-09-06 DIAGNOSIS — Z87891 Personal history of nicotine dependence: Secondary | ICD-10-CM

## 2011-09-06 DIAGNOSIS — I10 Essential (primary) hypertension: Secondary | ICD-10-CM | POA: Diagnosis present

## 2011-09-06 DIAGNOSIS — I120 Hypertensive chronic kidney disease with stage 5 chronic kidney disease or end stage renal disease: Secondary | ICD-10-CM | POA: Diagnosis present

## 2011-09-06 HISTORY — DX: Shortness of breath: R06.02

## 2011-09-06 LAB — BASIC METABOLIC PANEL
CO2: 27 mEq/L (ref 19–32)
Calcium: 9.6 mg/dL (ref 8.4–10.5)
Creatinine, Ser: 2.38 mg/dL — ABNORMAL HIGH (ref 0.50–1.10)
Glucose, Bld: 195 mg/dL — ABNORMAL HIGH (ref 70–99)

## 2011-09-06 LAB — DIFFERENTIAL
Basophils Absolute: 0 10*3/uL (ref 0.0–0.1)
Eosinophils Absolute: 0 10*3/uL (ref 0.0–0.7)
Eosinophils Relative: 0 % (ref 0–5)

## 2011-09-06 LAB — GLUCOSE, CAPILLARY
Glucose-Capillary: 267 mg/dL — ABNORMAL HIGH (ref 70–99)
Glucose-Capillary: 310 mg/dL — ABNORMAL HIGH (ref 70–99)

## 2011-09-06 LAB — MRSA PCR SCREENING: MRSA by PCR: NEGATIVE

## 2011-09-06 LAB — CBC
MCH: 29.1 pg (ref 26.0–34.0)
MCV: 89.7 fL (ref 78.0–100.0)
Platelets: 216 10*3/uL (ref 150–400)
RDW: 15.3 % (ref 11.5–15.5)

## 2011-09-06 MED ORDER — SODIUM CHLORIDE 0.9 % IV SOLN
3.0000 g | Freq: Once | INTRAVENOUS | Status: AC
  Administered 2011-09-06: 3 g via INTRAVENOUS
  Filled 2011-09-06 (×3): qty 3

## 2011-09-06 MED ORDER — METOPROLOL TARTRATE 1 MG/ML IV SOLN
2.5000 mg | Freq: Four times a day (QID) | INTRAVENOUS | Status: DC
  Administered 2011-09-06 – 2011-09-07 (×4): 2.5 mg via INTRAVENOUS
  Filled 2011-09-06 (×7): qty 5

## 2011-09-06 MED ORDER — WARFARIN - PHARMACIST DOSING INPATIENT: Freq: Every day | Status: DC

## 2011-09-06 MED ORDER — SODIUM CHLORIDE 0.9 % IV SOLN
INTRAVENOUS | Status: DC
  Administered 2011-09-06: 20 mL/h via INTRAVENOUS

## 2011-09-06 MED ORDER — VANCOMYCIN HCL IN DEXTROSE 1-5 GM/200ML-% IV SOLN
1000.0000 mg | INTRAVENOUS | Status: DC
  Administered 2011-09-07: 1000 mg via INTRAVENOUS
  Filled 2011-09-06 (×2): qty 200

## 2011-09-06 MED ORDER — MORPHINE SULFATE 2 MG/ML IJ SOLN
0.5000 mg | INTRAMUSCULAR | Status: DC | PRN
  Administered 2011-09-07 (×2): 0.5 mg via INTRAVENOUS
  Filled 2011-09-06: qty 1

## 2011-09-06 MED ORDER — HEPARIN SODIUM (PORCINE) 1000 UNIT/ML DIALYSIS
20.0000 [IU]/kg | INTRAMUSCULAR | Status: DC | PRN
  Administered 2011-09-07: 1700 [IU] via INTRAVENOUS_CENTRAL
  Filled 2011-09-06: qty 2

## 2011-09-06 MED ORDER — SODIUM CHLORIDE 0.9 % IJ SOLN
3.0000 mL | Freq: Two times a day (BID) | INTRAMUSCULAR | Status: DC
  Administered 2011-09-06 – 2011-09-08 (×4): 3 mL via INTRAVENOUS

## 2011-09-06 MED ORDER — DIGOXIN 0.25 MG/ML IJ SOLN
0.1250 mg | INTRAMUSCULAR | Status: DC
  Administered 2011-09-06: 0.5 mg via INTRAVENOUS
  Filled 2011-09-06: qty 2
  Filled 2011-09-06: qty 0.5

## 2011-09-06 MED ORDER — METHYLPREDNISOLONE SODIUM SUCC 125 MG IJ SOLR
125.0000 mg | Freq: Once | INTRAMUSCULAR | Status: AC
  Administered 2011-09-06: 125 mg via INTRAVENOUS
  Filled 2011-09-06: qty 2

## 2011-09-06 MED ORDER — SODIUM CHLORIDE 0.9 % IV SOLN
3.0000 g | INTRAVENOUS | Status: DC
  Administered 2011-09-07: 3 g via INTRAVENOUS
  Filled 2011-09-06 (×3): qty 3

## 2011-09-06 MED ORDER — VANCOMYCIN HCL 1000 MG IV SOLR
1750.0000 mg | Freq: Once | INTRAVENOUS | Status: AC
  Administered 2011-09-06: 1750 mg via INTRAVENOUS
  Filled 2011-09-06: qty 1750

## 2011-09-06 MED ORDER — DIPHENHYDRAMINE HCL 50 MG/ML IJ SOLN
25.0000 mg | Freq: Once | INTRAMUSCULAR | Status: AC
  Administered 2011-09-06: 25 mg via INTRAVENOUS
  Filled 2011-09-06: qty 1

## 2011-09-06 MED ORDER — LIDOCAINE VISCOUS 2 % MT SOLN
20.0000 mL | Freq: Once | OROMUCOSAL | Status: AC
  Administered 2011-09-06: 20 mL via OROMUCOSAL

## 2011-09-06 MED ORDER — IOHEXOL 300 MG/ML  SOLN
75.0000 mL | Freq: Once | INTRAMUSCULAR | Status: AC | PRN
  Administered 2011-09-06: 75 mL via INTRAVENOUS

## 2011-09-06 MED ORDER — INSULIN ASPART 100 UNIT/ML ~~LOC~~ SOLN
0.0000 [IU] | Freq: Every day | SUBCUTANEOUS | Status: DC
  Administered 2011-09-06: 4 [IU] via SUBCUTANEOUS
  Administered 2011-09-07: 2 [IU] via SUBCUTANEOUS

## 2011-09-06 MED ORDER — FAMOTIDINE IN NACL 20-0.9 MG/50ML-% IV SOLN
20.0000 mg | Freq: Once | INTRAVENOUS | Status: AC
  Administered 2011-09-06: 20 mg via INTRAVENOUS
  Filled 2011-09-06: qty 50

## 2011-09-06 MED ORDER — INSULIN ASPART 100 UNIT/ML ~~LOC~~ SOLN
0.0000 [IU] | Freq: Three times a day (TID) | SUBCUTANEOUS | Status: DC
  Administered 2011-09-06: 5 [IU] via SUBCUTANEOUS
  Administered 2011-09-07 (×2): 3 [IU] via SUBCUTANEOUS
  Administered 2011-09-08 (×2): 2 [IU] via SUBCUTANEOUS

## 2011-09-06 MED ORDER — METOPROLOL TARTRATE 1 MG/ML IV SOLN
INTRAVENOUS | Status: AC
  Filled 2011-09-06: qty 5

## 2011-09-06 NOTE — Progress Notes (Signed)
PHARMACY - ANTIBIOTIC CONSULT  INITIAL NOTE  Pharmacy Consult for: Vancomycin, Unasyn  Indication: Ludwig's angina   Patient Data:   Allergies: Allergies  Allergen Reactions  . Sulfa Antibiotics Other (See Comments)    Unknown-childhood allergy    Patient Measurements: Height: 5\' 4"  (162.6 cm) Weight: 177 lb (80.287 kg) (Per 08/25/11 documentation) IBW/kg (Calculated) : 54.7   Vital Signs: Temp:  [98.7 F (37.1 C)] 98.7 F (37.1 C) (04/08 0617) Pulse Rate:  [79-134] 134  (04/08 0617) Resp:  [16-20] 16  (04/08 0617) BP: (148-154)/(84-100) 148/100 mmHg (04/08 0617) SpO2:  [92 %-98 %] 92 % (04/08 0617) Weight:  [177 lb (80.287 kg)] 177 lb (80.287 kg) (04/08 0617)  Intake/Output from previous day: No intake or output data in the 24 hours ending 09/06/11 0640  Labs:  Basename 09/06/11 0319  WBC 13.7*  HGB 12.7  PLT 216  LABCREA --  CREATININE 2.38*   Estimated Creatinine Clearance: 25.1 ml/min (by C-G formula based on Cr of 2.38). No results found for this basename: VANCOTROUGH:2,VANCOPEAK:2,VANCORANDOM:2,GENTTROUGH:2,GENTPEAK:2,GENTRANDOM:2,TOBRATROUGH:2,TOBRAPEAK:2,TOBRARND:2,AMIKACINPEAK:2,AMIKACINTROU:2,AMIKACIN:2, in the last 72 hours   Microbiology: No results found for this or any previous visit (from the past 720 hour(s)).  Medical History: Past Medical History  Diagnosis Date  . Diabetes mellitus     Type II  . Hypertension   . Atrial fibrillation   . Anemia     Anemia of chronic disease  . Thyroid disease     Secondary Hyperparathyroidism  . Necrotizing fasciitis 2012  . CHF (congestive heart failure)   . Chronic kidney disease     T TH [facility] [address]    Scheduled Medications:     . diphenhydrAMINE  25 mg Intravenous Once  . famotidine (PEPCID) IV  20 mg Intravenous Once  . methylPREDNISolone (SOLU-MEDROL) injection  125 mg Intravenous Once      Assessment:  61 y.o. 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 Pharmacy consulted to  manage vancomycin and Unasyn.   Goal of Therapy:  1. Pre-HD vancomycin level 15 - 25 mcg/mL  Plan:  1. Vancomycin 1750mg  IV x 1, then 1gm IV Q-HD (Tues-Thurs-Sat). 2. Unasyn 3gm IV Q24H.    Dineen Kid Thad Ranger, PharmD 09/06/2011, 6:40 AM

## 2011-09-06 NOTE — Consult Note (Signed)
Reason for Consult: Neck swelling Referring Physician: Emergency department  Kelly Gallegos is an 61 y.o. female.  HPI: Several day history of left submandibular swelling and pain. She has a history of having had this in a more mild fashion intermittently for several years. She has not been to a dentist in over 25 years.  Past Medical History  Diagnosis Date  . Diabetes mellitus     Type II  . Hypertension   . Atrial fibrillation   . Anemia     Anemia of chronic disease  . Thyroid disease     Secondary Hyperparathyroidism  . Necrotizing fasciitis 2012  . CHF (congestive heart failure)   . Shortness of breath   . Chronic kidney disease     T TH [facility] [address]    Past Surgical History  Procedure Date  . Av fistula placement 02/10/2011    Left Brachiocephalic Arteriovenous Fistula Formation  . Back surgery 10-2010    pt. 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 Avf revision 07-23-2011    left    Family History  Problem Relation Age of Onset  . Anesthesia problems Neg Hx   . Hypotension Neg Hx   . Malignant hyperthermia Neg Hx   . Pseudochol deficiency Neg Hx     Social History:  reports that she quit smoking about 15 months ago. She does not have any smokeless tobacco history on file. She reports that she does not drink alcohol or use illicit drugs.  Allergies:  Allergies  Allergen Reactions  . Sulfa Antibiotics Other (See Comments)    Unknown-childhood allergy    Medications: I have reviewed the patient's current medications.  Results for orders placed during the hospital encounter of 09/06/11 (from the past 48 hour(s))  BASIC METABOLIC PANEL     Status: Abnormal   Collection Time   09/06/11  3:19 AM      Component Value Range Comment   Sodium 132 (*) 135 - 145 (mEq/L)    Potassium 3.7  3.5 - 5.1 (mEq/L)    Chloride 93 (*) 96 - 112 (mEq/L)    CO2 27  19 - 32 (mEq/L)    Glucose, Bld 195 (*) 70 - 99 (mg/dL)    BUN 30  (*) 6 - 23 (mg/dL)    Creatinine, Ser 4.09 (*) 0.50 - 1.10 (mg/dL)    Calcium 9.6  8.4 - 10.5 (mg/dL)    GFR calc non Af Amer 21 (*) >90 (mL/min)    GFR calc Af Amer 24 (*) >90 (mL/min)   CBC     Status: Abnormal   Collection Time   09/06/11  3:19 AM      Component Value Range Comment   WBC 13.7 (*) 4.0 - 10.5 (K/uL)    RBC 4.36  3.87 - 5.11 (MIL/uL)    Hemoglobin 12.7  12.0 - 15.0 (g/dL)    HCT 81.1  91.4 - 78.2 (%)    MCV 89.7  78.0 - 100.0 (fL)    MCH 29.1  26.0 - 34.0 (pg)    MCHC 32.5  30.0 - 36.0 (g/dL)    RDW 95.6  21.3 - 08.6 (%)    Platelets 216  150 - 400 (K/uL)   DIFFERENTIAL     Status: Abnormal   Collection Time   09/06/11  3:19 AM      Component Value Range Comment   Neutrophils Relative 82 (*) 43 - 77 (%)  Neutro Abs 11.2 (*) 1.7 - 7.7 (K/uL)    Lymphocytes Relative 12  12 - 46 (%)    Lymphs Abs 1.7  0.7 - 4.0 (K/uL)    Monocytes Relative 6  3 - 12 (%)    Monocytes Absolute 0.8  0.1 - 1.0 (K/uL)    Eosinophils Relative 0  0 - 5 (%)    Eosinophils Absolute 0.0  0.0 - 0.7 (K/uL)    Basophils Relative 0  0 - 1 (%)    Basophils Absolute 0.0  0.0 - 0.1 (K/uL)   PROTIME-INR     Status: Abnormal   Collection Time   09/06/11  6:16 AM      Component Value Range Comment   Prothrombin Time 22.0 (*) 11.6 - 15.2 (seconds)    INR 1.89 (*) 0.00 - 1.49    DIGOXIN LEVEL     Status: Abnormal   Collection Time   09/06/11  6:34 AM      Component Value Range Comment   Digoxin Level 0.4 (*) 0.8 - 2.0 (ng/mL)     Ct Soft Tissue Neck W Contrast  09/06/2011  *RADIOLOGY REPORT*  Clinical Data: Left-sided facial swelling; difficulty swallowing.  CT NECK WITH CONTRAST  Technique:  Multidetector CT imaging of the neck was performed with intravenous contrast.  Contrast: 75mL OMNIPAQUE IOHEXOL 300 MG/ML  SOLN  Comparison: None.  Findings: There is significant left-sided soft tissue inflammation, tracking along the floor of the mouth, about the left submandibular gland, and along the upper left  neck.  Associated soft tissue edema extends about the parapharyngeal soft tissues on the left side, involving the left palatine tonsil and parapharyngeal fat planes, and also resulting in some degree of prevertebral soft tissue edema.  Findings are compatible with deep left-sided cellulitis; no abscess is identified at this time.  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  A large calcification is noted at the left floor of the mouth, of uncertain significance; this may reflect a calcified node.  There is significant narrowing of the hypopharynx, due to left- sided mass effect.  The adenoids and right palatine tonsil are unremarkable in appearance.  Borderline enlarged left cervical nodes are seen.  The parotid glands are within normal limits. There is no definite evidence of vascular compromise. Calcification is noted at the carotid bifurcations bilaterally.  Additional soft tissue edema is noted extending along the anterior upper chest.  Small to moderate right and small left pleural effusions are seen, as on the prior chest radiograph.  The visualized superior mediastinum is grossly unremarkable in appearance.  The expanded portions of the lungs are grossly unremarkable.  The visualized paranasal sinuses and mastoid air cells are well- aerated.  The orbits are grossly unremarkable in appearance.  The visualized portions of the brain are unremarkable.  There is chronic absence of multiple maxillary and mandibular teeth.  No acute osseous abnormalities are seen.  There is chronic narrowing of the intervertebral disc space at C5-C6.  IMPRESSION:  1.  No evidence of abscess formation at this time. 2.  Significant left-sided soft tissue inflammation, tracking along the floor of the mouth, about the left submandibular gland, and along the upper left neck, with additional soft tissue inflammation extending along the anterior chest wall.  Prevertebral soft tissue edema also  noted.  Findings compatible with deep left-sided cellulitis.  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 3.  Narrowing of the hypopharynx  due to left-sided mass effect. 4.  Borderline left cervical lymphadenopathy noted. 5.  Large calcification at the left lower the mouth, uncertain significance; this may reflect a calcified node. 6.  Small to moderate right and small left pleural effusions, as on the prior chest radiograph.  These results were called by telephone on 09/06/2011  at  04:20 a.m. to  Dr. Mancel Bale, who verbally acknowledged these results.  Original Report Authenticated By: Tonia Ghent, M.D.    ROS: Otherwise negative  Blood pressure 160/89, pulse 108, temperature 98.4 F (36.9 C), temperature source Oral, resp. rate 17, height 5\' 4"  (1.626 m), weight 177 lb (80.287 kg), SpO2 99.00%.  PHYSICAL EXAM: Overall appearance: Elderly lady, in no distress Head:  Normocephalic, atraumatic. Ears: External auditory canals are clear; tympanic membranes are intact in the middle ears are free of any effusion. Nose: External nose is healthy in appearance. Internal nasal exam free of any lesions or obstruction. Oral Cavity:  There are no mucosal lesions or masses identified. There is significant for dental disease throughout upper and lower. There is a fractured tooth, left lower premolar. There is some surrounding edema and erythema. There is no floor of mouth edema. Oral Pharynx/Hypopharynx/Larynx: no signs of any mucosal lesions or masses identified. Neuro:  No identifiable neurologic deficits. Neck: Left firm mildly tender submandibular swelling.  Studies Reviewed: CT reviewed  Assessment/Plan: Submandibular swelling, most likely from an odontogenic source. Recommend urgent consultation with oral surgery as she likely require require tooth extraction of that left mandibular tooth. There are no airway concerns and there is no evidence of abscess on imaging.  Roxan Yamamoto,  Paelyn Smick 09/06/2011, 1:06 PM

## 2011-09-06 NOTE — H&P (Signed)
IM Attending  55 woman in [facility] program, admitted for severe odontogenic infection of floor of mouth.  Had rapidly progressive swelling of left neck proceeding to dysphagia.  Already better overnight on vanc and unasyn.  No abscess.  ENT has been consulted for advice and contingency care in case of worsening. In [facility] after a 6 m onth admission at [facility] last year for necrotizing fasciitis of gluteal region.  On hemodialysis for ESRD.  Also has CHF. Have visited and examined patient.  Agree with management.

## 2011-09-06 NOTE — ED Notes (Signed)
Pt states that she gets dialysis on Tuesday, Thursdays, and Saturday

## 2011-09-06 NOTE — Consult Note (Signed)
[facility] Renal Consultation Note  Indication for Consultation:  Management of ESRD/hemodialysis; anemia, hypertension/volume and secondary hyperparathyroidism  HPI: Kelly Gallegos is a 61 y.o. female with ESRD on HD at [facility] who presented to the ED last night with worsening left submandibular swelling and pain for the last week.  On 4/2 she first noticed mild swelling, which by 4/6 had become very tender and much larger with fever of 100 and difficulty swallowing secretions.  She contacted her Primary Care Physician who initially provided Augmentin, but recommended hospital evaluation when symptoms worsened.  She is currently feeling better on IV antibiotics since her arrival.    Dialysis Orders: Center: [facility]  on TTS . EDW 80.5 kg   HD Bath 2K/2.25Ca  Time 4 hrs 15 mins   Heparin 2600 U. Access Right IJ catheter  BFR 400 DFR 800    Zemplar 0 mcg IV/HD   Epogen 3000 Units IV/HD  Venofer 50 mg qwk   Past Medical History  Diagnosis Date  . Diabetes mellitus     Type II  . Hypertension   . Atrial fibrillation   . Anemia     Anemia of chronic disease  . Thyroid disease     Secondary Hyperparathyroidism  . Necrotizing fasciitis 2012  . CHF (congestive heart failure)   . Shortness of breath   . Chronic kidney disease     T TH [facility] [facility]   Past Surgical History  Procedure Date  . Av fistula placement 02/10/2011    Left Brachiocephalic Arteriovenous Fistula Formation  . Back surgery 10-2010    pt. 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 Avf revision 07-23-2011    left   Family History  Problem Relation Age of Onset  . Anesthesia problems Neg Hx   . Hypotension Neg Hx   . Malignant hyperthermia Neg Hx   . Pseudochol deficiency Neg Hx   She states that both her mother and sister died of unknown cancer.  Social History She reports that she quit smoking cigarettes on 05/31/10 after a 20-year history of one-half pack a day.  She  occasionally drinks alcohol, but not excessively, and denies any use of illicit drugs.  She previously worked as a Conservation officer, nature and is currently separated and lives with her daughter, one of four kids.  Allergies  Allergen Reactions  . Sulfa Antibiotics Other (See Comments)    Unknown-childhood allergy   Prior to Admission medications   Medication Sig Start Date End Date Taking? Authorizing Provider  amoxicillin-clavulanate (AUGMENTIN) 875-125 MG per tablet Take 1 tablet by mouth 2 (two) times daily.   Yes Historical Provider, MD  calcium carbonate (TUMS - DOSED IN MG ELEMENTAL CALCIUM) 500 MG chewable tablet Chew 1 tablet by mouth 3 (three) times daily before meals. Also before snacks   Yes Historical Provider, MD  digoxin (LANOXIN) 0.125 MG tablet Take 125 mcg by mouth 3 (three) times a week. Monday, Wednesday, Friday   Yes Historical Provider, MD  glipiZIDE (GLUCOTROL) 5 MG tablet Take 5 mg by mouth 2 (two) times daily before a meal.   Yes Historical Provider, MD  metoprolol tartrate (LOPRESSOR) 25 MG tablet Take 25 mg by mouth 2 (two) times daily.   Yes Historical Provider, MD  Multiple Vitamin (MULITIVITAMIN WITH MINERALS) TABS Take 1 tablet by mouth daily.   Yes Historical Provider, MD  warfarin (COUMADIN) 5 MG tablet Take 5 mg by mouth daily. Takes 5  mg 7 days week.   Yes Historical Provider, MD   Labs:  Results for orders placed during the hospital encounter of 09/06/11 (from the past 48 hour(s))  BASIC METABOLIC PANEL     Status: Abnormal   Collection Time   09/06/11  3:19 AM      Component Value Range Comment   Sodium 132 (*) 135 - 145 (mEq/L)    Potassium 3.7  3.5 - 5.1 (mEq/L)    Chloride 93 (*) 96 - 112 (mEq/L)    CO2 27  19 - 32 (mEq/L)    Glucose, Bld 195 (*) 70 - 99 (mg/dL)    BUN 30 (*) 6 - 23 (mg/dL)    Creatinine, Ser 1.61 (*) 0.50 - 1.10 (mg/dL)    Calcium 9.6  8.4 - 10.5 (mg/dL)    GFR calc non Af Amer 21 (*) >90 (mL/min)    GFR calc Af Amer 24 (*) >90 (mL/min)   CBC      Status: Abnormal   Collection Time   09/06/11  3:19 AM      Component Value Range Comment   WBC 13.7 (*) 4.0 - 10.5 (K/uL)    RBC 4.36  3.87 - 5.11 (MIL/uL)    Hemoglobin 12.7  12.0 - 15.0 (g/dL)    HCT 09.6  04.5 - 40.9 (%)    MCV 89.7  78.0 - 100.0 (fL)    MCH 29.1  26.0 - 34.0 (pg)    MCHC 32.5  30.0 - 36.0 (g/dL)    RDW 81.1  91.4 - 78.2 (%)    Platelets 216  150 - 400 (K/uL)   DIFFERENTIAL     Status: Abnormal   Collection Time   09/06/11  3:19 AM      Component Value Range Comment   Neutrophils Relative 82 (*) 43 - 77 (%)    Neutro Abs 11.2 (*) 1.7 - 7.7 (K/uL)    Lymphocytes Relative 12  12 - 46 (%)    Lymphs Abs 1.7  0.7 - 4.0 (K/uL)    Monocytes Relative 6  3 - 12 (%)    Monocytes Absolute 0.8  0.1 - 1.0 (K/uL)    Eosinophils Relative 0  0 - 5 (%)    Eosinophils Absolute 0.0  0.0 - 0.7 (K/uL)    Basophils Relative 0  0 - 1 (%)    Basophils Absolute 0.0  0.0 - 0.1 (K/uL)   PROTIME-INR     Status: Abnormal   Collection Time   09/06/11  6:16 AM      Component Value Range Comment   Prothrombin Time 22.0 (*) 11.6 - 15.2 (seconds)    INR 1.89 (*) 0.00 - 1.49    DIGOXIN LEVEL     Status: Abnormal   Collection Time   09/06/11  6:34 AM      Component Value Range Comment   Digoxin Level 0.4 (*) 0.8 - 2.0 (ng/mL)   GLUCOSE, CAPILLARY     Status: Abnormal   Collection Time   09/06/11  1:18 PM      Component Value Range Comment   Glucose-Capillary 262 (*) 70 - 99 (mg/dL)    Comment 1 Documented in Chart      Comment 2 Notify RN      Constitutional: positive for fever, no chills or fatigue Ears, nose, mouth, throat, and face: positive for submandibular swelling, pain, and dysphagia Respiratory: negative for cough, dyspnea on exertion and wheezing Cardiovascular: negative for chest pain, chest  pressure/discomfort, dyspnea and orthopnea Gastrointestinal: negative for abdominal pain, diarrhea, nausea and vomiting Genitourinary:oliguric Musculoskeletal:negative for arthralgias,  muscle weakness and myalgias Neurological: positive for headaches, but negative for dizziness and weakness  Physical Exam: Filed Vitals:   09/06/11 1215  BP: 151/93  Pulse: 133  Temp:   Resp: 19     General appearance: alert, cooperative and no distress Head: Normocephalic, without obvious abnormality, atraumatic Neck: left submandibular swelling with mild tenderness to palpation Resp: clear to auscultation bilaterally Cardio: irregularly irregular rhythm, tachycardic GI: soft, non-tender; bowel sounds normal; no masses,  no organomegaly Extremities: extremities normal, atraumatic, no cyanosis or edema Neurologic: Grossly normal Dialysis Access: Right IJ catheter, AVF @ [facility] (revised on 2/22)  Assessment/Plan: 1.  Left submandibular swelling - CT indicates left-sided soft tissue inflammation about the submandibular gland, along the neck, and extending along the anterior chest wall, compatible with cellulitis; involvement of the floor of mouth reflects Ludwig's angina; on IV Unasyn and Vancomycin. 2.  ESRD -  On HD on TTS, K stable at 3.7.  Next HD tomorrow. 3.  Hypertension/volume  - BP elevated today, most recently 168/95, on Metoprolol 2.5 mg q6h IV (25 mg bid as outpatient). 4.  Anemia  - Hgb 12.7 on outpatient Epogen and IV Fe (s/p recent IV Fe x 5 Txs). 5.  Metabolic bone disease -  Ca 9.6, no recent P (but usually controlled on Tums Ultra), off Zemplar. 6.  Nutrition - Last Alb 3.7. 7.  Chronic atrial fibrillation - on Coumadin, Digoxin, and BB. 8.  DM - last Hgb A1c 7.3 on 1/24, on Glipizide 5 mg bid since 2/26. 9.  History of necrotizing fasciitis - on back, resolved s/p surgery and wound vac [facility].   LYLES,CHARLES 09/06/2011, 2:08 PM   Attending Nephrologist: Delano Metz, MD  Patient seen and examined and agree with assessment and plan as above.  Vinson Moselle  MD [facility] [phone] pgr    [phone] cell 09/06/2011, 4:03 PM

## 2011-09-06 NOTE — H&P (Signed)
Hospital Admission Note Date: 09/06/2011  Patient name: Kelly Gallegos Medical record number: [id number] Date of birth: [birth year] Age: 61 y.o. Gender: female PCP: Thane Edu, MD, MD  Medical Service:          Internal Medicine Teaching Service    Attending physician:  Dr.  Arnaldo Natal  1st Contact:  Dr. Abner Greenspan     Pager: [phone] 2nd Contact:  Dr. Tonny Branch  Pager: [phone] After 5 pm or weekends: 1st Contact:      Pager: [phone] 2nd Contact:      Pager: [phone]  Chief Complaint:  Chief Complaint  Patient presents with  . Oral Swelling  . Sore Throat     History of Present Illness: Kelly Gallegos is a 61 y.o.female with past medical history significant for afib with RVR, CHF (unknown last ejection fraction), diabetes mellitus (unknown last hemoglobin A1c,) ESRD on HD T, TH, Sat who presents with left sided neck pain and swelling for 5 days.    Kelly Gallegos states that on  Tuesday she noticed some swelling on the left submandibular area. She pointed this out to Dr. Dorise Hiss on Wednesday and he elected to monitor it. However It kept growing and getting more painful and moving medially until yesterday, when she noted difficulty swallowing and fever to 100. She contacted her PCP who suggested that she take Augmentin. However she continued to have pain, swelling, and difficulty swallowing her secretions. She then came to the ED on the recommendation of her PCP. She states that she has a throbbing pain in her left jaw.Her last food intake was yesterday when she noted that it was difficult to open her mouth. She states that she ate chicken soup which was difficult for her to swallow. She states that it is difficult for her to swallow her secretions, and endorsed mild difficulty with respirations, but denied shortness of breath.  Denies, N/v/d, constipation, chest pain, SOB  Review of Systems: Constitutional: Endorses fever HEENT: Endorses baseline blurred  vision, but denies change. Endorses neck pain, swelling, and difficulty swallowing. Denies difficulty hearing.  Respiratory: Denies SOB, DOE, cough, chest tightness,  and wheezing.   endorses mild difficulty breathing. Cardiovascular: Denies chest pain, palpitations and leg swelling.  Gastrointestinal: Denies nausea, vomiting, abdominal pain,  Genitourinary: Denies dysuria, does still urinate Musculoskeletal: Denies myalgias, back pain, joint swelling, arthralgias and gait problem.  Skin: Denies pallor, rash and wound.  Neurological: Denies dizziness, weakness, numbness and headaches.  Hematological: Endorses left-sided neck swelling Psychiatric/Behavioral: Denies Confusion  Past Medical History  Diagnosis Date  . Diabetes mellitus     Type II  . Hypertension   . Atrial fibrillation   . Anemia     Anemia of chronic disease  . Thyroid disease     Secondary Hyperparathyroidism  . Necrotizing fasciitis 2012  . CHF (congestive heart failure)   . Chronic kidney disease     T TH SAT [facility]    Past Surgical History  Procedure Date  . Av fistula placement 02/10/2011    Left Brachiocephalic Arteriovenous Fistula Formation  . Back surgery 10-2010    pt. 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 Avf revision 07-23-2011    left    Meds: Medications Prior to Admission  Medication Dose Route Frequency Provider Last Rate Last Dose  . 0.9 %  sodium chloride infusion   Intravenous Continuous Flint Melter, MD 10 mL/hr at 09/06/11 0505 10 mL/hr  at 09/06/11 0505  . diphenhydrAMINE (BENADRYL) injection 25 mg  25 mg Intravenous Once Flint Melter, MD   25 mg at 09/06/11 0334  . famotidine (PEPCID) IVPB 20 mg  20 mg Intravenous Once Flint Melter, MD   20 mg at 09/06/11 0340  . iohexol (OMNIPAQUE) 300 MG/ML solution 75 mL  75 mL Intravenous Once PRN Medication Radiologist, MD   75 mL at 09/06/11 0407  . methylPREDNISolone sodium succinate  (SOLU-MEDROL) 125 mg/2 mL injection 125 mg  125 mg Intravenous Once Flint Melter, MD   125 mg at 09/06/11 0335   Medications Prior to Admission  Medication Sig Dispense Refill  . Warfarin Sodium (COUMADIN PO) Take by mouth daily.      . B Complex-C-Folic Acid (DIALYVITE PO) Take 1 tablet by mouth daily.       . calcium carbonate (TUMS - DOSED IN MG ELEMENTAL CALCIUM) 500 MG chewable tablet Chew 1 tablet by mouth 3 (three) times daily before meals. Also before snacks      . digoxin (LANOXIN) 0.125 MG tablet Take 125 mcg by mouth 3 (three) times a week. Monday, Wednesday, Friday      . glipiZIDE (GLUCOTROL XL) 5 MG 24 hr tablet Take 5 mg by mouth daily.      . metoprolol tartrate (LOPRESSOR) 25 MG tablet Take 25 mg by mouth 2 (two) times daily.        Allergies: Sulfa antibiotics  Family History  Problem Relation Age of Onset  . Anesthesia problems Neg Hx   . Hypotension Neg Hx   . Malignant hyperthermia Neg Hx   . Pseudochol deficiency Neg Hx     History   Social History  . Marital Status: Single    Spouse Name: N/A    Number of Children: N/A  . Years of Education: N/A   Occupational History  . Not on file.   Social History Main Topics  . Smoking status: Former Smoker    Quit date: 05/31/2010  . Smokeless tobacco: Not on file  . Alcohol Use: No  . Drug Use: No  . Sexually Active: Not on file   Other Topics Concern  . Not on file   Social History Narrative  . 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 Got HF and necrotizing fasciitis which led to a long hospitalization, disability, and she no longer works. Graduated 12 grade.    Physical Exam: Blood pressure 154/100, pulse 133, temperature 98.7 F (37.1 C), temperature source Oral, resp. rate 20, SpO2 92.00%. Gen: Well-developed, well-nourished female  in no acute distress; alert, appropriate and cooperative throughout examination. Head: Normocephalic, atraumatic. Eyes: PERRL, No signs of anemia  or jaundince. Nose: Mucous membranes moist, not inflammed, nonerythematous. Throat: Oropharynx nonerythematous, no exudate appreciated. Patient could only open her mouth approximately 3 cm. Speaking without difficulty.  Neck: Left-sided submandibular mass with swelling extending to the lower left jaw. Mild superficial erythema. The mass is tender to palpation. Lungs: Normal respiratory effort. Clear to auscultation BL, without crackles or wheezes. Heart: Irregularly irregular heart rate with tachycardia. S1 and S2 normal without  murmur, gallop,or rubs. Abdomen: BS normoactive. Soft, nondistended, non-tender. No masses or organomegaly. Extremities: No pretibial edema. Neurologic: No focal neurologic deficits, cranial nerves II through XII grossly intact  Skin: No visible rashes, scars. Psych: mood and affect are normal.    Lab results: Basic Metabolic Panel:  Basename 09/06/11 0319  NA 132*  K 3.7  CL 93*  CO2 27  GLUCOSE 195*  BUN 30*  CREATININE 2.38*  CALCIUM 9.6  MG --  PHOS --   CBC:  Basename 09/06/11 0319  WBC 13.7*  NEUTROABS 11.2*  HGB 12.7  HCT 39.1  MCV 89.7  PLT 216   Imaging results:  Ct Soft Tissue Neck W Contrast  09/06/2011  *RADIOLOGY REPORT*  Clinical Data: Left-sided facial swelling; difficulty swallowing.  CT NECK WITH CONTRAST  Technique:  Multidetector CT imaging of the neck was performed with intravenous contrast.  Contrast: 75mL OMNIPAQUE IOHEXOL 300 MG/ML  SOLN  Comparison: None.  Findings: There is significant left-sided soft tissue inflammation, tracking along the floor of the mouth, about the left submandibular gland, and along the upper left neck.  Associated soft tissue edema extends about the parapharyngeal soft tissues on the left side, involving the left palatine tonsil and parapharyngeal fat planes, and also resulting in some degree of prevertebral soft tissue edema.  Findings are compatible with deep left-sided cellulitis; no abscess is  identified at this time.  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  A large calcification is noted at the left floor of the mouth, of uncertain significance; this may reflect a calcified node.  There is significant narrowing of the hypopharynx, due to left- sided mass effect.  The adenoids and right palatine tonsil are unremarkable in appearance.  Borderline enlarged left cervical nodes are seen.  The parotid glands are within normal limits. There is no definite evidence of vascular compromise. Calcification is noted at the carotid bifurcations bilaterally.  Additional soft tissue edema is noted extending along the anterior upper chest.  Small to moderate right and small left pleural effusions are seen, as on the prior chest radiograph.  The visualized superior mediastinum is grossly unremarkable in appearance.  The expanded portions of the lungs are grossly unremarkable.  The visualized paranasal sinuses and mastoid air cells are well- aerated.  The orbits are grossly unremarkable in appearance.  The visualized portions of the brain are unremarkable.  There is chronic absence of multiple maxillary and mandibular teeth.  No acute osseous abnormalities are seen.  There is chronic narrowing of the intervertebral disc space at C5-C6.  IMPRESSION:  1.  No evidence of abscess formation at this time. 2.  Significant left-sided soft tissue inflammation, tracking along the floor of the mouth, about the left submandibular gland, and along the upper left neck, with additional soft tissue inflammation extending along the anterior chest wall.  Prevertebral soft tissue edema also noted.  Findings compatible with deep left-sided cellulitis.  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 3.  Narrowing of the hypopharynx due to left-sided mass effect. 4.  Borderline left cervical lymphadenopathy noted. 5.  Large calcification at the left lower the mouth, uncertain  significance; this may reflect a calcified node. 6.  Small to moderate right and small left pleural effusions, as on the prior chest radiograph.  These results were called by telephone on 09/06/2011  at  04:20 a.m. to  Dr. Mancel Bale, who verbally acknowledged these results.  Original Report Authenticated By: Tonia Ghent, M.D.      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  The patient will need to be observed closely for any airway compromise. The patient has progressive swelling or dyspnea she  should be intubated. ENT was consulted by the ED physician and will evaluate the patient this morning. Given given the polymicrobial nature of Ludwig's angina, the patient's prior history of necrotizing fasciitis and diabetes and high risk for healthcare associated infection due to her PACE enrollment, the patient will be started on IV vancomycin and Unasyn. Patient will need any oral medications changed to IV formulations due to her difficulty swallowing her secretions. -- Admitted to step down -- We'll follow blood cultures -- IV vancomycin and Unasyn -- Pain control with IV morphine 0.5 mg every 4 hours -- ENT consulted -- Very low threshold for intubation  Active Problems:  2) Chronic a-fib - patient is currently in A. fib with RVR with heart rates to the 130s. She will need rate control and anticoagulation. Since she cannot swallow, we will give her IV relations of her home metoprolol, digoxin, and warfarin at equivalent doses. -- metoprolol at 2.5 mg every 6 hours.  Hold on dialysis days -- Will give IV warfarin per pharmacy. -- Digoxin 0.125 mg every Monday Wednesday Friday  3) End stage renal disease - the patient receives hemodialysis every Tuesday, Thursday, Saturday. Patient will need dialysis while she is admitted. -- Will contact renal team to  have them set up hemodialysis for her  4) DM (diabetes mellitus) - unknown hemoglobin A1c. Will contact PCP regarding medical records.  Will place patient on sliding scale insulin sensitive protocol. Will hold glipizide.  5) CHF (congestive heart failure) - unknown last ejection fraction. We'll contact PCP regarding records. In the meantime will not hydrate, as the patient does not appear volume depleted. Will continue metoprolol as above.  6) Hypertension - patient is slightly hypertensive to 150s over 100s. Will continue metoprolol as above.   7) Anemia of chronic disease - according to her records patient's baseline hemoglobin is approximately 12-13. The patient is currently at 12.7. We'll contact PCP regarding records. We'll make no interventions at this time.  8) DVT prophylaxis - warfarin -- Will check INR  Signed: Yanelie Gallegos 09/06/2011, 5:38 AM

## 2011-09-06 NOTE — ED Notes (Signed)
Pt was seen by ENT

## 2011-09-06 NOTE — ED Notes (Signed)
Attempted to call report but receiving RN is unavailable.

## 2011-09-06 NOTE — ED Notes (Signed)
In to assess pt. She is alert and oriented. Noticed on monitor that pt heart rate 120-140 afib. Pt with known hx of afib. States her normal hr around 60-70. She denies sob or cp.

## 2011-09-06 NOTE — Progress Notes (Signed)
ANTICOAGULATION CONSULT NOTE - Initial Consult  Pharmacy Consult for Coumadin Indication: atrial fibrillation  Allergies  Allergen Reactions  . Sulfa Antibiotics Other (See Comments)    Unknown-childhood allergy    Patient Measurements: Height: 5\' 4"  (162.6 cm) Weight: 177 lb (80.287 kg) (Per 08/25/11 documentation) IBW/kg (Calculated) : 54.7  Vital Signs: Temp: 98.4 F (36.9 C) (04/08 0949) Temp src: Oral (04/08 0949) BP: 151/93 mmHg (04/08 1215) Pulse Rate: 133  (04/08 1215)  Labs:  Alvira Philips 09/06/11 0616 09/06/11 0319  HGB -- 12.7  HCT -- 39.1  PLT -- 216  APTT -- --  LABPROT 22.0* --  INR 1.89* --  HEPARINUNFRC -- --  CREATININE -- 2.38*  CKTOTAL -- --  CKMB -- --  TROPONINI -- --   Estimated Creatinine Clearance: 25.1 ml/min (by C-G formula based on Cr of 2.38).  Medical History: Past Medical History  Diagnosis Date  . Diabetes mellitus     Type II  . Hypertension   . Atrial fibrillation   . Anemia     Anemia of chronic disease  . Thyroid disease     Secondary Hyperparathyroidism  . Necrotizing fasciitis 2012  . CHF (congestive heart failure)   . Shortness of breath   . Chronic kidney disease     T TH SAT [facility]    Medications:  Prescriptions prior to admission  Medication Sig Dispense Refill  . amoxicillin-clavulanate (AUGMENTIN) 875-125 MG per tablet Take 1 tablet by mouth 2 (two) times daily.      . calcium carbonate (TUMS - DOSED IN MG ELEMENTAL CALCIUM) 500 MG chewable tablet Chew 1 tablet by mouth 3 (three) times daily before meals. Also before snacks      . digoxin (LANOXIN) 0.125 MG tablet Take 125 mcg by mouth 3 (three) times a week. Monday, Wednesday, Friday      . glipiZIDE (GLUCOTROL) 5 MG tablet Take 5 mg by mouth 2 (two) times daily before a meal.      . metoprolol tartrate (LOPRESSOR) 25 MG tablet Take 25 mg by mouth 2 (two) times daily.      . Multiple Vitamin (MULITIVITAMIN WITH MINERALS) TABS Take 1 tablet by mouth daily.        Marland Kitchen warfarin (COUMADIN) 5 MG tablet Take 5 mg by mouth daily. Takes 5 mg 7 days week.        Assessment: 61yo female with 5 day history left sided neck pain and swelling- was given Augmentin by outpatient MD.   Anticoagulation: Patient was on Coumadin for Afib PTA. INR today is 1.89. Home regimen was Coumadin 5mg  po daily. Pharmacy has been asked to resume Coumadin. Patient currently has no IV access. CBC is within normal limits except for a mildly elevated WBC being treated by antibiotics per pharmacy (see previous note). Spoke with Internal Medicine MD concerning plan for possible oral surgery per ENT. Dr. Berlinda Last with Internal medicine wishes to hold coumadin until determine surgical plans.   Goal of Therapy:  INR 2-3   Plan:  1. Hold Coumadin per Dr. Berlinda Last until determine surgical plans. 2. Will follow-up with Internal medicine tomorrow. 3. Daily PT/INR.   Link Snuffer, PharmD, BCPS Clinical Pharmacist [phone] 09/06/2011,2:16 PM

## 2011-09-06 NOTE — ED Notes (Signed)
ekg has been performed and shown to md. Pt continues cp free and denies sob. She is on nasal cannula oxygen. Charge nurse made aware of change in pt condition. Will prepare to move pt to pod for closer monitoring.

## 2011-09-06 NOTE — ED Notes (Signed)
Have spoken with teaching service. Medications given as ordered. Pt being moved to pod for further monitoring.

## 2011-09-06 NOTE — ED Notes (Signed)
Admitting MD at bedside.

## 2011-09-06 NOTE — ED Provider Notes (Signed)
History     CSN: [id number]  Arrival date & time 09/06/11  0229   First MD Initiated Contact with Patient 09/06/11 0258      Chief Complaint  Patient presents with  . Oral Swelling  . Sore Throat    (Consider location/radiation/quality/duration/timing/severity/associated sxs/prior treatment) HPI Comments: Kelly Gallegos is a 61 y.o. female with swelling, primarily left neck, for 6 days. She was seen by her PCP 5 days ago, who advised watching the problem. Today, the patient contacted him by phone stating she was worse and he called in a prescription for Augmentin. She has taken 2 doses. She denies fever. She feels like her throat is closing up and has trouble swallowing. She has never had this problem previously. She is not taking an ACE inhibitor. She has been taking her usual medications. She denies fever, chills, nausea, vomiting, chest pain, shortness of breath, back pain, weakness, dizziness. She last dialyzed 2 days ago.  Patient is a 61 y.o. female presenting with pharyngitis. The history is provided by the patient.  Sore Throat    Past Medical History  Diagnosis Date  . Diabetes mellitus     Type II  . Hypertension   . Atrial fibrillation   . Anemia     Anemia of chronic disease  . Thyroid disease     Secondary Hyperparathyroidism  . Necrotizing fasciitis 2012  . CHF (congestive heart failure)   . Chronic kidney disease     T TH [facility]    Past Surgical History  Procedure Date  . Av fistula placement 02/10/2011    Left Brachiocephalic Arteriovenous Fistula Formation  . Back surgery 10-2010    pt. 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 Avf revision 07-23-2011    left    Family History  Problem Relation Age of Onset  . Anesthesia problems Neg Hx   . Hypotension Neg Hx   . Malignant hyperthermia Neg Hx   . Pseudochol deficiency Neg Hx     History  Substance Use Topics  . Smoking status: Former Smoker   Quit date: 05/31/2010  . Smokeless tobacco: Not on file  . Alcohol Use: No    OB History    Grav Para Term Preterm Abortions TAB SAB Ect Mult Living                  Review of Systems  All other systems reviewed and are negative.    Allergies  Sulfa antibiotics  Home Medications   Current Outpatient Rx  Name Route Sig Dispense Refill  . DIALYVITE PO Oral Take 1 tablet by mouth daily.     Marland Kitchen CALCIUM CARBONATE ANTACID 500 MG PO CHEW Oral Chew 1 tablet by mouth 3 (three) times daily before meals. Also before snacks    . DIGOXIN 0.125 MG PO TABS Oral Take 125 mcg by mouth 3 (three) times a week. Monday, Wednesday, Friday    . GLIPIZIDE ER 5 MG PO TB24 Oral Take 5 mg by mouth daily.    Marland Kitchen METOPROLOL TARTRATE 25 MG PO TABS Oral Take 25 mg by mouth 2 (two) times daily.    Marland Kitchen COUMADIN PO Oral Take by mouth daily.      BP 154/100  Pulse 133  Temp(Src) 98.7 F (37.1 C) (Oral)  Resp 20  SpO2 92%  Physical Exam  Nursing note and vitals reviewed. Constitutional: She is oriented to person, place, and time. She  appears well-developed and well-nourished.  HENT:  Head: Normocephalic and atraumatic.       There is no tongue swelling. There is indistinct edema sublingual. There is no pharyngeal redness or swelling. The airway is intact.   Eyes: Conjunctivae and EOM are normal. Pupils are equal, round, and reactive to light.  Neck: Normal range of motion and phonation normal. Neck supple.       There is a mass beneath her left mandible, firm, approximately 4 x 4 centimeters.  The mass is not mobile. There is mild. Associated diffuse swelling of the left side of the neck. No stridor.  Cardiovascular: Normal rate, regular rhythm and intact distal pulses.   Pulmonary/Chest: Effort normal and breath sounds normal. No respiratory distress. She has no wheezes. She has no rales. She exhibits no tenderness.       Dialysis catheter right upper chest wall, site appears normal  Abdominal: Soft. She  exhibits no distension. There is no tenderness. There is no guarding.  Musculoskeletal: Normal range of motion.  Neurological: She is alert and oriented to person, place, and time. She has normal strength. She exhibits normal muscle tone.  Skin: Skin is warm and dry.  Psychiatric: She has a normal mood and affect. Her behavior is normal. Judgment and thought content normal.    ED Course  Procedures (including critical care time)  05:00 Reevaluation with update and discussion. After initial assessment and treatment, an updated evaluation reveals Pt is resting comfortably. No Stridor.Repeat vitals are reassuring. I discussed the case with Dr Modena Jansky, who would like [facility] to admit. They will see pt and write for ABX. They asked me to  Contact ENT- Dr Pollyann Kennedy apprised of findings, and will see pt later today, he agrees that medicine can admit.Mancel Bale L    Labs Reviewed  BASIC METABOLIC PANEL - Abnormal; Notable for the following:    Sodium 132 (*)    Chloride 93 (*)    Glucose, Bld 195 (*)    BUN 30 (*)    Creatinine, Ser 2.38 (*)    GFR calc non Af Amer 21 (*)    GFR calc Af Amer 24 (*)    All other components within normal limits  CBC - Abnormal; Notable for the following:    WBC 13.7 (*)    All other components within normal limits  DIFFERENTIAL - Abnormal; Notable for the following:    Neutrophils Relative 82 (*)    Neutro Abs 11.2 (*)    All other components within normal limits  CULTURE, BLOOD (ROUTINE X 2)  CULTURE, BLOOD (ROUTINE X 2)   Ct Soft Tissue Neck W Contrast  09/06/2011  *RADIOLOGY REPORT*  Clinical Data: Left-sided facial swelling; difficulty swallowing.  CT NECK WITH CONTRAST  Technique:  Multidetector CT imaging of the neck was performed with intravenous contrast.  Contrast: 75mL OMNIPAQUE IOHEXOL 300 MG/ML  SOLN  Comparison: None.  Findings: There is significant left-sided soft tissue inflammation, tracking along the floor of the mouth, about the left  submandibular gland, and along the upper left neck.  Associated soft tissue edema extends about the parapharyngeal soft tissues on the left side, involving the left palatine tonsil and parapharyngeal fat planes, and also resulting in some degree of prevertebral soft tissue edema.  Findings are compatible with deep left-sided cellulitis; no abscess is identified at this time.  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  A large calcification is noted at the  left floor of the mouth, of uncertain significance; this may reflect a calcified node.  There is significant narrowing of the hypopharynx, due to left- sided mass effect.  The adenoids and right palatine tonsil are unremarkable in appearance.  Borderline enlarged left cervical nodes are seen.  The parotid glands are within normal limits. There is no definite evidence of vascular compromise. Calcification is noted at the carotid bifurcations bilaterally.  Additional soft tissue edema is noted extending along the anterior upper chest.  Small to moderate right and small left pleural effusions are seen, as on the prior chest radiograph.  The visualized superior mediastinum is grossly unremarkable in appearance.  The expanded portions of the lungs are grossly unremarkable.  The visualized paranasal sinuses and mastoid air cells are well- aerated.  The orbits are grossly unremarkable in appearance.  The visualized portions of the brain are unremarkable.  There is chronic absence of multiple maxillary and mandibular teeth.  No acute osseous abnormalities are seen.  There is chronic narrowing of the intervertebral disc space at C5-C6.  IMPRESSION:  1.  No evidence of abscess formation at this time. 2.  Significant left-sided soft tissue inflammation, tracking along the floor of the mouth, about the left submandibular gland, and along the upper left neck, with additional soft tissue inflammation extending along the anterior chest  wall.  Prevertebral soft tissue edema also noted.  Findings compatible with deep left-sided cellulitis.  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 3.  Narrowing of the hypopharynx due to left-sided mass effect. 4.  Borderline left cervical lymphadenopathy noted. 5.  Large calcification at the left lower the mouth, uncertain significance; this may reflect a calcified node. 6.  Small to moderate right and small left pleural effusions, as on the prior chest radiograph.  These results were called by telephone on 09/06/2011  at  04:20 a.m. to  Dr. Mancel Bale, who verbally acknowledged these results.  Original Report Authenticated By: Tonia Ghent, M.D.     1. Cellulitis of neck       MDM  Neck cellulitis, source unclear; unlikely to be associated with her dialysis catheter. Patient is currently stable without evidence for abscess or in immediate danger of airway compromise. She'll need to be admitted for parenteral antibiotics, close observation and consultation with ENT. Her renal status is stable at this time.  Plan: Admit- Admit to medicine         Flint Melter, MD 09/06/11 [phone]

## 2011-09-06 NOTE — ED Notes (Signed)
Called teaching service. Spoke with dr Loistine Chance. She is aware pt hr now 140-147. Advises to just give the metoprolol. Restated pt hr and that is her only order

## 2011-09-06 NOTE — ED Notes (Signed)
Wentz MD at bedside. 

## 2011-09-06 NOTE — ED Notes (Signed)
Pt received from cdu [id number] able to move all extremities await ready step down bed pt on monitor and o2 at 2l [place]

## 2011-09-06 NOTE — ED Notes (Signed)
MD at bedside. 

## 2011-09-06 NOTE — ED Notes (Signed)
Internal medicine came to see and examine the patient

## 2011-09-06 NOTE — ED Notes (Signed)
C/o sore throat and throat swelling since Wednesday.  Pt states she was seen by PCP on Wednesday and was told glands were swollen.  Called PCP office on Sunday and had antibiotic called in.

## 2011-09-07 ENCOUNTER — Inpatient Hospital Stay (HOSPITAL_COMMUNITY): Payer: Medicare (Managed Care)

## 2011-09-07 LAB — RENAL FUNCTION PANEL
Albumin: 3.2 g/dL — ABNORMAL LOW (ref 3.5–5.2)
Chloride: 93 mEq/L — ABNORMAL LOW (ref 96–112)
Creatinine, Ser: 3.36 mg/dL — ABNORMAL HIGH (ref 0.50–1.10)
GFR calc non Af Amer: 14 mL/min — ABNORMAL LOW (ref >90)
Potassium: 4.3 mEq/L (ref 3.5–5.1)

## 2011-09-07 LAB — CBC
HCT: 37.4 % (ref 36.0–46.0)
MCH: 28.4 pg (ref 26.0–34.0)
MCHC: 31.3 g/dL (ref 30.0–36.0)
MCV: 90.8 fL (ref 78.0–100.0)
Platelets: 257 10*3/uL (ref 150–400)
RDW: 15.5 % (ref 11.5–15.5)
WBC: 12.3 10*3/uL — ABNORMAL HIGH (ref 4.0–10.5)

## 2011-09-07 LAB — GLUCOSE, CAPILLARY
Glucose-Capillary: 112 mg/dL — ABNORMAL HIGH (ref 70–99)
Glucose-Capillary: 214 mg/dL — ABNORMAL HIGH (ref 70–99)
Glucose-Capillary: 213 mg/dL — ABNORMAL HIGH (ref 70–99)

## 2011-09-07 LAB — HEMOGLOBIN A1C: Mean Plasma Glucose: 160 mg/dL — ABNORMAL HIGH (ref <117)

## 2011-09-07 MED ORDER — SODIUM CHLORIDE 0.9 % IV SOLN: 125.0000 mg | INTRAVENOUS | Status: DC

## 2011-09-07 MED ORDER — INSULIN GLARGINE 100 UNIT/ML ~~LOC~~ SOLN
15.0000 [IU] | Freq: Every day | SUBCUTANEOUS | Status: DC
  Administered 2011-09-07: 15 [IU] via SUBCUTANEOUS

## 2011-09-07 MED ORDER — DIGOXIN 125 MCG PO TABS
0.1250 mg | ORAL_TABLET | ORAL | Status: DC
  Administered 2011-09-08: 0.125 mg via ORAL
  Filled 2011-09-07: qty 1

## 2011-09-07 MED ORDER — METOPROLOL TARTRATE 25 MG PO TABS
25.0000 mg | ORAL_TABLET | Freq: Two times a day (BID) | ORAL | Status: DC
  Administered 2011-09-07 – 2011-09-08 (×2): 25 mg via ORAL
  Filled 2011-09-07 (×4): qty 1

## 2011-09-07 MED ORDER — MORPHINE SULFATE 2 MG/ML IJ SOLN
INTRAMUSCULAR | Status: AC
  Administered 2011-09-07: 0.5 mg via INTRAVENOUS
  Filled 2011-09-07: qty 1

## 2011-09-07 MED ORDER — ACETAMINOPHEN 325 MG PO TABS
650.0000 mg | ORAL_TABLET | Freq: Four times a day (QID) | ORAL | Status: DC | PRN
  Administered 2011-09-07 – 2011-09-08 (×4): 650 mg via ORAL
  Filled 2011-09-07 (×4): qty 2

## 2011-09-07 MED ORDER — DARBEPOETIN ALFA-POLYSORBATE 25 MCG/0.42ML IJ SOLN: 25.0000 ug | INTRAMUSCULAR | Status: DC

## 2011-09-07 NOTE — Consult Note (Signed)
Reason for Consult:left mandibular/neck swelling Referring Physician: Berlinda Last, MD  Kelly Gallegos is an 61 y.o. female.  CC: Left neck swelling.   HPI: Patient relates one week history of swelling left jaw/neck swelling. Gradually increased in size until admission. Caused difficulty swallowing. Getting better since IV antibiotics began in hospital.  Denies tooth pain.   Past Medical History  Diagnosis Date  . Diabetes mellitus     Type II  . Hypertension   . Atrial fibrillation   . Anemia     Anemia of chronic disease  . Thyroid disease     Secondary Hyperparathyroidism  . Necrotizing fasciitis 2012  . CHF (congestive heart failure)   . Shortness of breath   . Chronic kidney disease     T [facility]     Past Surgical History  Procedure Date  . Av fistula placement 02/10/2011    Left Brachiocephalic Arteriovenous Fistula Formation  . Back surgery 10-2010    pt. 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 Avf revision 07-23-2011    left    Family History  Problem Relation Age of Onset  . Anesthesia problems Neg Hx   . Hypotension Neg Hx   . Malignant hyperthermia Neg Hx   . Pseudochol deficiency Neg Hx     Social History:  reports that she quit smoking about 15 months ago. She does not have any smokeless tobacco history on file. She reports that she does not drink alcohol or use illicit drugs.  Allergies:  Allergies  Allergen Reactions  . Sulfa Antibiotics Other (See Comments)    Unknown-childhood allergy    Medications: I have reviewed the patient's current medications.  Results for orders placed during the hospital encounter of 09/06/11 (from the past 48 hour(s))  BASIC METABOLIC PANEL     Status: Abnormal   Collection Time   09/06/11  3:19 AM      Component Value Range Comment   Sodium 132 (*) 135 - 145 (mEq/L)    Potassium 3.7  3.5 - 5.1 (mEq/L)    Chloride 93 (*) 96 - 112 (mEq/L)    CO2 27  19 - 32 (mEq/L)    Glucose, Bld 195 (*) 70 - 99 (mg/dL)    BUN 30 (*) 6 - 23 (mg/dL)    Creatinine, Ser 1.61 (*) 0.50 - 1.10 (mg/dL)    Calcium 9.6  8.4 - 10.5 (mg/dL)    GFR calc non Af Amer 21 (*) >90 (mL/min)    GFR calc Af Amer 24 (*) >90 (mL/min)   CBC     Status: Abnormal   Collection Time   09/06/11  3:19 AM      Component Value Range Comment   WBC 13.7 (*) 4.0 - 10.5 (K/uL)    RBC 4.36  3.87 - 5.11 (MIL/uL)    Hemoglobin 12.7  12.0 - 15.0 (g/dL)    HCT 09.6  04.5 - 40.9 (%)    MCV 89.7  78.0 - 100.0 (fL)    MCH 29.1  26.0 - 34.0 (pg)    MCHC 32.5  30.0 - 36.0 (g/dL)    RDW 81.1  91.4 - 78.2 (%)    Platelets 216  150 - 400 (K/uL)   DIFFERENTIAL     Status: Abnormal   Collection Time   09/06/11  3:19 AM      Component Value Range Comment   Neutrophils Relative 82 (*) 43 - 77 (%)  Neutro Abs 11.2 (*) 1.7 - 7.7 (K/uL)    Lymphocytes Relative 12  12 - 46 (%)    Lymphs Abs 1.7  0.7 - 4.0 (K/uL)    Monocytes Relative 6  3 - 12 (%)    Monocytes Absolute 0.8  0.1 - 1.0 (K/uL)    Eosinophils Relative 0  0 - 5 (%)    Eosinophils Absolute 0.0  0.0 - 0.7 (K/uL)    Basophils Relative 0  0 - 1 (%)    Basophils Absolute 0.0  0.0 - 0.1 (K/uL)   CULTURE, BLOOD (ROUTINE X 2)     Status: Normal (Preliminary result)   Collection Time   09/06/11  5:00 AM      Component Value Range Comment   Specimen Description BLOOD RIGHT HAND      Special Requests THUMB AREA BOTTLES DRAWN AEROBIC AND ANAEROBIC 5CC      Culture  Setup Time [id number]      Culture        Value:        BLOOD CULTURE RECEIVED NO GROWTH TO DATE CULTURE WILL BE HELD FOR 5 DAYS BEFORE ISSUING A FINAL NEGATIVE REPORT   Report Status PENDING     CULTURE, BLOOD (ROUTINE X 2)     Status: Normal (Preliminary result)   Collection Time   09/06/11  5:10 AM      Component Value Range Comment   Specimen Description BLOOD RIGHT HAND      Special Requests TOP BOTTLES DRAWN AEROBIC ONLY 6CC      Culture  Setup Time [id number]      Culture         Value:        BLOOD CULTURE RECEIVED NO GROWTH TO DATE CULTURE WILL BE HELD FOR 5 DAYS BEFORE ISSUING A FINAL NEGATIVE REPORT   Report Status PENDING     PROTIME-INR     Status: Abnormal   Collection Time   09/06/11  6:16 AM      Component Value Range Comment   Prothrombin Time 22.0 (*) 11.6 - 15.2 (seconds)    INR 1.89 (*) 0.00 - 1.49    DIGOXIN LEVEL     Status: Abnormal   Collection Time   09/06/11  6:34 AM      Component Value Range Comment   Digoxin Level 0.4 (*) 0.8 - 2.0 (ng/mL)   GLUCOSE, CAPILLARY     Status: Abnormal   Collection Time   09/06/11  1:18 PM      Component Value Range Comment   Glucose-Capillary 262 (*) 70 - 99 (mg/dL)    Comment 1 Documented in Chart      Comment 2 Notify RN     MRSA PCR SCREENING     Status: Normal   Collection Time   09/06/11  1:58 PM      Component Value Range Comment   MRSA by PCR NEGATIVE  NEGATIVE    GLUCOSE, CAPILLARY     Status: Abnormal   Collection Time   09/06/11  4:49 PM      Component Value Range Comment   Glucose-Capillary 267 (*) 70 - 99 (mg/dL)    Comment 1 Notify RN      Comment 2 Documented in Chart     GLUCOSE, CAPILLARY     Status: Abnormal   Collection Time   09/06/11  9:38 PM      Component Value Range Comment   Glucose-Capillary 310 (*) 70 - 99 (  mg/dL)    Comment 1 Documented in Chart      Comment 2 Notify RN     GLUCOSE, CAPILLARY     Status: Abnormal   Collection Time   09/07/11  7:56 AM      Component Value Range Comment   Glucose-Capillary 216 (*) 70 - 99 (mg/dL)    Comment 1 Notify RN     RENAL FUNCTION PANEL     Status: Abnormal   Collection Time   09/07/11  9:03 AM      Component Value Range Comment   Sodium 131 (*) 135 - 145 (mEq/L)    Potassium 4.3  3.5 - 5.1 (mEq/L)    Chloride 93 (*) 96 - 112 (mEq/L)    CO2 24  19 - 32 (mEq/L)    Glucose, Bld 195 (*) 70 - 99 (mg/dL)    BUN 54 (*) 6 - 23 (mg/dL)    Creatinine, Ser 4.09 (*) 0.50 - 1.10 (mg/dL)    Calcium 9.5  8.4 - 10.5 (mg/dL)    Phosphorus 6.0 (*) 2.3  - 4.6 (mg/dL)    Albumin 3.2 (*) 3.5 - 5.2 (g/dL)    GFR calc non Af Amer 14 (*) >90 (mL/min)    GFR calc Af Amer 16 (*) >90 (mL/min)   CBC     Status: Abnormal   Collection Time   09/07/11  9:03 AM      Component Value Range Comment   WBC 12.3 (*) 4.0 - 10.5 (K/uL)    RBC 4.12  3.87 - 5.11 (MIL/uL)    Hemoglobin 11.7 (*) 12.0 - 15.0 (g/dL)    HCT 81.1  91.4 - 78.2 (%)    MCV 90.8  78.0 - 100.0 (fL)    MCH 28.4  26.0 - 34.0 (pg)    MCHC 31.3  30.0 - 36.0 (g/dL)    RDW 95.6  21.3 - 08.6 (%)    Platelets 257  150 - 400 (K/uL)   PROTIME-INR     Status: Abnormal   Collection Time   09/07/11  9:03 AM      Component Value Range Comment   Prothrombin Time 25.9 (*) 11.6 - 15.2 (seconds)    INR 2.32 (*) 0.00 - 1.49    GLUCOSE, CAPILLARY     Status: Abnormal   Collection Time   09/07/11  2:04 PM      Component Value Range Comment   Glucose-Capillary 112 (*) 70 - 99 (mg/dL)    Comment 1 Documented in Chart      Comment 2 Notify RN       Dg Orthopantogram  09/06/2011  *RADIOLOGY REPORT*  Clinical Data: Left jaw and facial pain.  ORTHOPANTOGRAM/PANORAMIC  Comparison: CT neck earlier in the day  Findings: Large ovoid  calcification on the left could represent a lymph node or submandibular gland calculus. Dental caries, especially left mandibular and left maxillary premolar/molars. Fragmentary change of what appears to be a right mandibular second or third molar of uncertain significance.    No facial fracture.  IMPRESSION: Mandible intact.  Advanced dental disease.  Large ovoid calcification redemonstrated.  Original Report Authenticated By: Elsie Stain, Gallegos.D.   Dg Chest 2 View  09/06/2011  *RADIOLOGY REPORT*  Clinical Data: Jaw and facial pain.  Shortness of breath. Hypertension and diabetes.  CHEST - 2 VIEW  Comparison: 07/23/2011  Findings: Cardiomegaly. Right greater than left pleural effusions with basilar atelectasis.  Dialysis catheter unchanged. No infiltrates or gross failure.  Moderate  vascular congestion.  Bones unremarkable.  IMPRESSION: Cardiomegaly with moderate vascular congestion and right greater than left pleural effusions.  Slight worsening aeration compared with February.  Original Report Authenticated By: Elsie Stain, Gallegos.D.   Ct Soft Tissue Neck W Contrast  09/06/2011  *RADIOLOGY REPORT*  Clinical Data: Left-sided facial swelling; difficulty swallowing.  CT NECK WITH CONTRAST  Technique:  Multidetector CT imaging of the neck was performed with intravenous contrast.  Contrast: 75mL OMNIPAQUE IOHEXOL 300 MG/ML  SOLN  Comparison: None.  Findings: There is significant left-sided soft tissue inflammation, tracking along the floor of the mouth, about the left submandibular gland, and along the upper left neck.  Associated soft tissue edema extends about the parapharyngeal soft tissues on the left side, involving the left palatine tonsil and parapharyngeal fat planes, and also resulting in some degree of prevertebral soft tissue edema.  Findings are compatible with deep left-sided cellulitis; no abscess is identified at this time.  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  A large calcification is noted at the left floor of the mouth, of uncertain significance; this may reflect a calcified node.  There is significant narrowing of the hypopharynx, due to left- sided mass effect.  The adenoids and right palatine tonsil are unremarkable in appearance.  Borderline enlarged left cervical nodes are seen.  The parotid glands are within normal limits. There is no definite evidence of vascular compromise. Calcification is noted at the carotid bifurcations bilaterally.  Additional soft tissue edema is noted extending along the anterior upper chest.  Small to moderate right and small left pleural effusions are seen, as on the prior chest radiograph.  The visualized superior mediastinum is grossly unremarkable in appearance.  The expanded portions of the  lungs are grossly unremarkable.  The visualized paranasal sinuses and mastoid air cells are well- aerated.  The orbits are grossly unremarkable in appearance.  The visualized portions of the brain are unremarkable.  There is chronic absence of multiple maxillary and mandibular teeth.  No acute osseous abnormalities are seen.  There is chronic narrowing of the intervertebral disc space at C5-C6.  IMPRESSION:  1.  No evidence of abscess formation at this time. 2.  Significant left-sided soft tissue inflammation, tracking along the floor of the mouth, about the left submandibular gland, and along the upper left neck, with additional soft tissue inflammation extending along the anterior chest wall.  Prevertebral soft tissue edema also noted.  Findings compatible with deep left-sided cellulitis.  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 3.  Narrowing of the hypopharynx due to left-sided mass effect. 4.  Borderline left cervical lymphadenopathy noted. 5.  Large calcification at the left lower the mouth, uncertain significance; this may reflect a calcified node. 6.  Small to moderate right and small left pleural effusions, as on the prior chest radiograph.  These results were called by telephone on 09/06/2011  at  04:20 a.Gallegos. to  Dr. Mancel Bale, who verbally acknowledged these results.  Original Report Authenticated By: Tonia Ghent, Gallegos.D.     Blood pressure 136/73, pulse 100, temperature 98.1 F (36.7 C), temperature source Oral, resp. rate 25, height 5\' 4"  (1.626 Gallegos), weight 80.5 kg (177 lb 7.5 oz), SpO2 97.00%. General appearance: alert, cooperative and no distress Head: Normocephalic, without obvious abnormality, atraumatic Eyes: negative Ears: normal TM's and external ear canals both ears Nose: Nares normal. Septum midline. Mucosa normal. No drainage or sinus tenderness.  Throat: Pharynx clear. No trismus. Tender swelling Left Floor of mouth. Unable to express saliva left FOM. Tooth #  19 with decay, non-tender, non-mobile. Neck: 4cm x 4 cm tender firm swelling left submandibular area. non-fluctuant Resp: clear to auscultation bilaterally Cardio: regular rate and rhythm, S1, S2 normal, no murmur, click, rub or gallop  Assessment/Plan: 61 WF DM, HTN, AFib, Anemia, CHF, SOB, Chronic renal disease with left submandibular swelling. Dental decay does not appear significant enough to have caused symptoms. Physical exam and CT suggestive of left submandibular calculi with ductal blockage. I will be happy to extract tooth # 19 as out-patient. Recommend re-evaluation by ENT regarding left submandibular gland.    Kelly Gallegos 09/07/2011, 3:44 PM

## 2011-09-07 NOTE — Progress Notes (Signed)
ANTICOAGULATION CONSULT NOTE - Follow Up Consult  Pharmacy Consult for Coumadin Indication: atrial fibrillation  Allergies  Allergen Reactions  . Sulfa Antibiotics Other (See Comments)    Unknown-childhood allergy    Patient Measurements: Height: 5\' 4"  (162.6 cm) Weight: 184 lb 15.5 oz (83.9 kg) IBW/kg (Calculated) : 54.7  Vital Signs: Temp: 98 F (36.7 C) (04/09 0840) Temp src: Oral (04/09 0840) BP: 138/90 mmHg (04/09 1200) Pulse Rate: 95  (04/09 1200)  Labs:  Basename 09/07/11 0903 09/06/11 0616 09/06/11 0319  HGB 11.7* -- 12.7  HCT 37.4 -- 39.1  PLT 257 -- 216  APTT -- -- --  LABPROT 25.9* 22.0* --  INR 2.32* 1.89* --  HEPARINUNFRC -- -- --  CREATININE 3.36* -- 2.38*  CKTOTAL -- -- --  CKMB -- -- --  TROPONINI -- -- --   Estimated Creatinine Clearance: 18.2 ml/min (by C-G formula based on Cr of 3.36).  Assessment: 61yo female with 5 day history left sided neck pain and swelling- was given Augmentin by outpatient MD.   Anticoagulation: Patient was on Coumadin for Afib PTA. INR 1.89 >>2.32 without Coumadin. Home regimen was Coumadin 5mg  po daily. CBC is within normal limits except for a mildly elevated WBC being treated by antibiotics per pharmacy (see previous note). Spoke with Internal Medicine MD (Dr. Berlinda Last) again today (09/07/2011, 12:31 PM) concerning plan for possible oral surgery per ENT. Dr. Berlinda Last with Internal medicine wishes to hold coumadin until determine surgical plans which will likely be in the next day or two.   Goal of Therapy:  INR 2-3   Plan:  1. Continue to hold Coumadin per Dr. Berlinda Last and Internal Medicine team.   Fayne Norrie 09/07/2011,12:30 PM

## 2011-09-07 NOTE — Progress Notes (Signed)
Subjective:  Feels better but says the lump is worse in her neck. Swallowing pills OK.   Objective:    Vital signs in last 24 hours: Filed Vitals:   09/07/11 1030 09/07/11 1100 09/07/11 1130 09/07/11 1200  BP: 104/65 115/55 134/82 138/90  Pulse: 88 86 78 95  Temp:      TempSrc:      Resp:      Height:      Weight:      SpO2:       Weight change: 2.313 kg (5 lb 1.6 oz) No intake or output data in the 24 hours ending 09/07/11 1247 Labs: Basic Metabolic Panel:  Lab 09/07/11 1610 09/06/11 0319  NA 131* 132*  K 4.3 3.7  CL 93* 93*  CO2 24 27  GLUCOSE 195* 195*  BUN 54* 30*  CREATININE 3.36* 2.38*  ALB -- --  CALCIUM 9.5 9.6  PHOS 6.0* --   Liver Function Tests:  Lab 09/07/11 0903  AST --  ALT --  ALKPHOS --  BILITOT --  PROT --  ALBUMIN 3.2*   No results found for this basename: LIPASE:3,AMYLASE:3 in the last 168 hours No results found for this basename: AMMONIA:3 in the last 168 hours CBC:  Lab 09/07/11 0903 09/06/11 0319  WBC 12.3* 13.7*  NEUTROABS -- 11.2*  HGB 11.7* 12.7  HCT 37.4 39.1  MCV 90.8 89.7  PLT 257 216   Cardiac Enzymes: No results found for this basename: CKTOTAL:5,CKMB:5,CKMBINDEX:5,TROPONINI:5 in the last 168 hours CBG:  Lab 09/07/11 0756 09/06/11 2138 09/06/11 1649 09/06/11 1318  GLUCAP 216* 310* 267* 262*    Iron Studies: No results found for this basename: IRON:30,TIBC:30,SATURATION RATIOS:30,TRANSFERRIN:30,FERRITIN:30 in the last 168 hours Studies/Results: Dg Orthopantogram  09/06/2011  *RADIOLOGY REPORT*  Clinical Data: Left jaw and facial pain.  ORTHOPANTOGRAM/PANORAMIC  Comparison: CT neck earlier in the day  Findings: Large ovoid  calcification on the left could represent a lymph node or submandibular gland calculus. Dental caries, especially left mandibular and left maxillary premolar/molars. Fragmentary change of what appears to be a right mandibular second or third molar of uncertain significance.    No facial fracture.   IMPRESSION: Mandible intact.  Advanced dental disease.  Large ovoid calcification redemonstrated.  Original Report Authenticated By: Elsie Stain, M.D.   Dg Chest 2 View  09/06/2011  *RADIOLOGY REPORT*  Clinical Data: Jaw and facial pain.  Shortness of breath. Hypertension and diabetes.  CHEST - 2 VIEW  Comparison: 07/23/2011  Findings: Cardiomegaly. Right greater than left pleural effusions with basilar atelectasis.  Dialysis catheter unchanged. No infiltrates or gross failure.  Moderate vascular congestion.  Bones unremarkable.  IMPRESSION: Cardiomegaly with moderate vascular congestion and right greater than left pleural effusions.  Slight worsening aeration compared with February.  Original Report Authenticated By: Elsie Stain, M.D.   Ct Soft Tissue Neck W Contrast  09/06/2011  *RADIOLOGY REPORT*  Clinical Data: Left-sided facial swelling; difficulty swallowing.  CT NECK WITH CONTRAST  Technique:  Multidetector CT imaging of the neck was performed with intravenous contrast.  Contrast: 75mL OMNIPAQUE IOHEXOL 300 MG/ML  SOLN  Comparison: None.  Findings: There is significant left-sided soft tissue inflammation, tracking along the floor of the mouth, about the left submandibular gland, and along the upper left neck.  Associated soft tissue edema extends about the parapharyngeal soft tissues on the left side, involving the left palatine tonsil and parapharyngeal fat planes, and also resulting in some degree of prevertebral soft tissue edema.  Findings are compatible with deep left-sided cellulitis; no abscess is identified at this time.  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  A large calcification is noted at the left floor of the mouth, of uncertain significance; this may reflect a calcified node.  There is significant narrowing of the hypopharynx, due to left- sided mass effect.  The adenoids and right palatine tonsil are unremarkable in appearance.   Borderline enlarged left cervical nodes are seen.  The parotid glands are within normal limits. There is no definite evidence of vascular compromise. Calcification is noted at the carotid bifurcations bilaterally.  Additional soft tissue edema is noted extending along the anterior upper chest.  Small to moderate right and small left pleural effusions are seen, as on the prior chest radiograph.  The visualized superior mediastinum is grossly unremarkable in appearance.  The expanded portions of the lungs are grossly unremarkable.  The visualized paranasal sinuses and mastoid air cells are well- aerated.  The orbits are grossly unremarkable in appearance.  The visualized portions of the brain are unremarkable.  There is chronic absence of multiple maxillary and mandibular teeth.  No acute osseous abnormalities are seen.  There is chronic narrowing of the intervertebral disc space at C5-C6.  IMPRESSION:  1.  No evidence of abscess formation at this time. 2.  Significant left-sided soft tissue inflammation, tracking along the floor of the mouth, about the left submandibular gland, and along the upper left neck, with additional soft tissue inflammation extending along the anterior chest wall.  Prevertebral soft tissue edema also noted.  Findings compatible with deep left-sided cellulitis.  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 3.  Narrowing of the hypopharynx due to left-sided mass effect. 4.  Borderline left cervical lymphadenopathy noted. 5.  Large calcification at the left lower the mouth, uncertain significance; this may reflect a calcified node. 6.  Small to moderate right and small left pleural effusions, as on the prior chest radiograph.  These results were called by telephone on 09/06/2011  at  04:20 a.m. to  Dr. Mancel Bale, who verbally acknowledged these results.  Original Report Authenticated By: Tonia Ghent, M.D.   Medications:    . DISCONTD: sodium chloride 10 mL/hr  (09/06/11 0505)      . ampicillin-sulbactam (UNASYN) IV  3 g Intravenous Q24H  . digoxin  0.125 mg Oral Q M,W,F  . insulin aspart  0-5 Units Subcutaneous QHS  . insulin aspart  0-9 Units Subcutaneous TID WC  . insulin glargine  15 Units Subcutaneous QHS  . lidocaine  20 mL Mouth/Throat Once  . metoprolol tartrate  25 mg Oral BID  . morphine      . sodium chloride  3 mL Intravenous Q12H  . vancomycin  1,000 mg Intravenous Q T,Th,Sa-HD  . Warfarin - Pharmacist Dosing Inpatient   Does not apply q1800  . DISCONTD: digoxin  0.125 mg Intravenous 3 times weekly  . DISCONTD: metoprolol  2.5 mg Intravenous Q6H    I  have reviewed scheduled and prn medications.  Physical Exam:  Blood pressure 138/90, pulse 95, temperature 98 F (36.7 C), temperature source Oral, resp. rate 11, height 5\' 4"  (1.626 m), weight 83.9 kg (184 lb 15.5 oz), SpO2 98.00%.  Gen: no distress  Neck: left submandibular swelling with mild tenderness to palpation  Resp: clear to auscultation bilaterally  Cardio: irregularly irregular rhythm, tachycardic  GI: soft, non-tender; bowel sounds normal; no masses, no  organomegaly  Extremities: extremities normal, atraumatic, no cyanosis or edema  Neurologic: Grossly normal  Dialysis Access: Right IJ catheter, AVF @ [facility] (revised on 2/22)  Dialysis Orders: Center: [facility] on TTS .  EDW 80.5 kg HD Bath 2K/2.25Ca Time 4 hrs 15 mins Heparin 2600 U. Access Right IJ catheter BFR 400 DFR 800  Zemplar 0 mcg IV/HD Epogen 3000 Units IV/HD Venofer 50 mg qwk    Assessment/Plan:  1. Facial/oral infection - prob odontogenic per ENT. IV unasyn and vancomycin 2. ESRD - On HD on TTS, K stable at 3.7. HD today 3. Hypertension/volume - BP elevated today, most recently 168/95, on Metoprolol 25 bid as at home.  4. Anemia - Hgb 12.7 on outpatient Epogen and weekly IV Fe (s/p recent IV Fe 500 mg load x 2 in last 2 mos).  5. Metabolic bone disease - Ca 9.6 / phos 6- continue Tums 1000 ac tid as at  home. No vit D.  6. Nutrition - Last Alb 3.7.  7. Chronic atrial fibrillation - on Coumadin, Digoxin, and BB.  8. DM - last Hgb A1c 7.3 on 1/24, on Glipizide 5 mg bid since 2/26.  9. History of necrotizing fasciitis - on back, resolved s/p surgery and wound vac [facility].   Kelly Moselle  MD [facility] [phone] pgr    [phone] cell 09/07/2011, 12:47 PM

## 2011-09-07 NOTE — Progress Notes (Signed)
IM Attending  Neck swelling is dramatically improved and she feels much better.  Less dysphagia.  Still has a 4-5 cm firm mass in left sub-mandibular region.  Panorex shows bad teeth.  Will discuss this with the on-call oral surgeon.

## 2011-09-07 NOTE — Progress Notes (Signed)
Inpatient Diabetes Program Recommendations  AACE/ADA: New Consensus Statement on Inpatient Glycemic Control  Target Ranges:  Prepandial:   less than 140 mg/dL      Peak postprandial:   less than 180 mg/dL (1-2 hours)      Critically ill patients:  140 - 180 mg/dL  Pager:  [phone] Hours:  8 am-10pm   Reason for Visit: Elevated glucose:  216 mg/dL  Inpatient Diabetes Program Recommendations Insulin - Basal: Add Lantus 15 units daily HgbA1C: Please check HgbA1C to assess glycemic control Diet: Add CHO modified medium

## 2011-09-07 NOTE — Procedures (Signed)
I was present at this dialysis session. I have reviewed the session itself and made appropriate changes.   Vinson Moselle, MD [employer] 09/07/2011, 12:56 PM

## 2011-09-07 NOTE — Progress Notes (Signed)
Subjective: No events overnight.  Pt feels much improved this morning.  No diff breathing, able to eat last night and swallow w/o difficulty.  Some mild pain in submandibular region.  Objective: Vital signs in last 24 hours: Filed Vitals:   09/06/11 1600 09/06/11 2000 09/07/11 0000 09/07/11 0410  BP: 152/77 138/76 140/84 146/84  Pulse:  120 115 105  Temp: 99.1 F (37.3 C) 99.1 F (37.3 C) 98.6 F (37 C) 97.7 F (36.5 C)  TempSrc: Oral Oral Oral Oral  Resp:  24 24 15   Height:      Weight:      SpO2:  98% 96% 98%   Weight change: 5 lb 1.6 oz (2.313 kg) No intake or output data in the 24 hours ending 09/07/11 0831 Physical Exam: Gen: NAD HEENT: improving erythema and edema on L submandiblar region.  Orophaynx w/o exudates.  Still palpable is marked firm, round nodule in L submandibular region and a smaller dime sized nodule just superior to it.  Lab Results: Basic Metabolic Panel:  Lab 09/06/11 1610  NA 132*  K 3.7  CL 93*  CO2 27  GLUCOSE 195*  BUN 30*  CREATININE 2.38*  CALCIUM 9.6  MG --  PHOS --   CBC:  Lab 09/06/11 0319  WBC 13.7*  NEUTROABS 11.2*  HGB 12.7  HCT 39.1  MCV 89.7  PLT 216   CBG:  Lab 09/07/11 0756 09/06/11 2138 09/06/11 1649 09/06/11 1318  GLUCAP 216* 310* 267* 262*   Coagulation:  Lab 09/06/11 0616  LABPROT 22.0*  INR 1.89*    Micro Results: Recent Results (from the past 240 hour(s))  CULTURE, BLOOD (ROUTINE X 2)     Status: Normal (Preliminary result)   Collection Time   09/06/11  5:00 AM      Component Value Range Status Comment   Specimen Description BLOOD RIGHT HAND   Final    Special Requests THUMB AREA BOTTLES DRAWN AEROBIC AND ANAEROBIC 5CC   Final    Culture  Setup Time [id number]   Final    Culture     Final    Value:        BLOOD CULTURE RECEIVED NO GROWTH TO DATE CULTURE WILL BE HELD FOR 5 DAYS BEFORE ISSUING A FINAL NEGATIVE REPORT   Report Status PENDING   Incomplete   CULTURE, BLOOD (ROUTINE X 2)     Status:  Normal (Preliminary result)   Collection Time   09/06/11  5:10 AM      Component Value Range Status Comment   Specimen Description BLOOD RIGHT HAND   Final    Special Requests TOP BOTTLES DRAWN AEROBIC ONLY [facility]   Final    Culture  Setup Time [id number]   Final    Culture     Final    Value:        BLOOD CULTURE RECEIVED NO GROWTH TO DATE CULTURE WILL BE HELD FOR 5 DAYS BEFORE ISSUING A FINAL NEGATIVE REPORT   Report Status PENDING   Incomplete   MRSA PCR SCREENING     Status: Normal   Collection Time   09/06/11  1:58 PM      Component Value Range Status Comment   MRSA by PCR NEGATIVE  NEGATIVE  Final    Studies/Results: Dg Orthopantogram  09/06/2011  *RADIOLOGY REPORT*  Clinical Data: Left jaw and facial pain.  ORTHOPANTOGRAM/PANORAMIC  Comparison: CT neck earlier in the day  Findings: Large ovoid  calcification on the left could represent  a lymph node or submandibular gland calculus. Dental caries, especially left mandibular and left maxillary premolar/molars. Fragmentary change of what appears to be a right mandibular second or third molar of uncertain significance.    No facial fracture.  IMPRESSION: Mandible intact.  Advanced dental disease.  Large ovoid calcification redemonstrated.  Original Report Authenticated By: Elsie Stain, M.D.   Dg Chest 2 View  09/06/2011  *RADIOLOGY REPORT*  Clinical Data: Jaw and facial pain.  Shortness of breath. Hypertension and diabetes.  CHEST - 2 VIEW  Comparison: 07/23/2011  Findings: Cardiomegaly. Right greater than left pleural effusions with basilar atelectasis.  Dialysis catheter unchanged. No infiltrates or gross failure.  Moderate vascular congestion.  Bones unremarkable.  IMPRESSION: Cardiomegaly with moderate vascular congestion and right greater than left pleural effusions.  Slight worsening aeration compared with February.  Original Report Authenticated By: Elsie Stain, M.D.   Ct Soft Tissue Neck W Contrast  09/06/2011  *RADIOLOGY REPORT*   Clinical Data: Left-sided facial swelling; difficulty swallowing.  CT NECK WITH CONTRAST  Technique:  Multidetector CT imaging of the neck was performed with intravenous contrast.  Contrast: 75mL OMNIPAQUE IOHEXOL 300 MG/ML  SOLN  Comparison: None.  Findings: There is significant left-sided soft tissue inflammation, tracking along the floor of the mouth, about the left submandibular gland, and along the upper left neck.  Associated soft tissue edema extends about the parapharyngeal soft tissues on the left side, involving the left palatine tonsil and parapharyngeal fat planes, and also resulting in some degree of prevertebral soft tissue edema.  Findings are compatible with deep left-sided cellulitis; no abscess is identified at this time.  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  A large calcification is noted at the left floor of the mouth, of uncertain significance; this may reflect a calcified node.  There is significant narrowing of the hypopharynx, due to left- sided mass effect.  The adenoids and right palatine tonsil are unremarkable in appearance.  Borderline enlarged left cervical nodes are seen.  The parotid glands are within normal limits. There is no definite evidence of vascular compromise. Calcification is noted at the carotid bifurcations bilaterally.  Additional soft tissue edema is noted extending along the anterior upper chest.  Small to moderate right and small left pleural effusions are seen, as on the prior chest radiograph.  The visualized superior mediastinum is grossly unremarkable in appearance.  The expanded portions of the lungs are grossly unremarkable.  The visualized paranasal sinuses and mastoid air cells are well- aerated.  The orbits are grossly unremarkable in appearance.  The visualized portions of the brain are unremarkable.  There is chronic absence of multiple maxillary and mandibular teeth.  No acute osseous abnormalities are seen.   There is chronic narrowing of the intervertebral disc space at C5-C6.  IMPRESSION:  1.  No evidence of abscess formation at this time. 2.  Significant left-sided soft tissue inflammation, tracking along the floor of the mouth, about the left submandibular gland, and along the upper left neck, with additional soft tissue inflammation extending along the anterior chest wall.  Prevertebral soft tissue edema also noted.  Findings compatible with deep left-sided cellulitis.  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 3.  Narrowing of the hypopharynx due to left-sided mass effect. 4.  Borderline left cervical lymphadenopathy noted. 5.  Large calcification at the left lower the mouth, uncertain significance; this may reflect a calcified node. 6.  Small  to moderate right and small left pleural effusions, as on the prior chest radiograph.  These results were called by telephone on 09/06/2011  at  04:20 a.m. to  Dr. Mancel Bale, who verbally acknowledged these results.  Original Report Authenticated By: Tonia Ghent, M.D.   Medications: I have reviewed the patient's current medications. Scheduled Meds:   . ampicillin-sulbactam (UNASYN) IV  3 g Intravenous Once  . ampicillin-sulbactam (UNASYN) IV  3 g Intravenous Q24H  . digoxin  0.125 mg Oral Q M,W,F  . insulin aspart  0-5 Units Subcutaneous QHS  . insulin aspart  0-9 Units Subcutaneous TID WC  . lidocaine  20 mL Mouth/Throat Once  . metoprolol tartrate  25 mg Oral BID  . sodium chloride  3 mL Intravenous Q12H  . vancomycin  1,750 mg Intravenous Once  . vancomycin  1,000 mg Intravenous Q T,Th,Sa-HD  . Warfarin - Pharmacist Dosing Inpatient   Does not apply q1800  . DISCONTD: digoxin  0.125 mg Intravenous 3 times weekly  . DISCONTD: metoprolol  2.5 mg Intravenous Q6H   Continuous Infusions:   . 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  Able to breath and swallow w/o difficulty.  On IV abx.  ENT consulted and felt that with no airway compromise and no abscess that they would sign off.  Spoke to on-call oral surgeon, Dr. Barbette Merino, who agreed that extraction would likely be necessary.  Question whetehr inpt vs outpt.  Then there is the question of the large calcified submandicular mass and if that is related to her infectious pathology and if that is an ENT problem.  Can be transferred out of step down today. - transfer to tele bed - change meds back to oral meds - appreciate coordination b/w Dr's Barbette Merino and Pollyann Kennedy - con't IV abx - discuss extraction, inpt vs outpt - holding coumadin for possible imminent extraction  # Chronic a-fib HR still not that well controlled, with HR to the 130s. Switching back to oral metoprolol, can titrate up or add med if needed. - metoprolol 25mg  PO bid - digoxin PO MWF - holding coumadin given possible extraction   # End stage renal disease T/Th/S- CXR does show bl/ pleural effusions, but c/w prior exams.  Will possibly need volume removal in HD today. - appreciate renal consultation - dialysis T/Th/Satthe patient receives hemodialysis every Tuesday, Thursday, Saturday. Patient will need dialysis while she is admitted.   # DM (diabetes mellitus) Unknown hemoglobin A1c, but on glipizide only. Will contact PCP regarding medical records.  - cont SSI - hold glipizide - consider lantus  # CHF (congestive heart failure) Unknown last ejection fraction. We'll contact PCP regarding records.   # Hypertension patient is slightly hypertensive to 150s over 100s. Will continue metoprolol as above.   # Anemia of chronic disease - according to her records patient's baseline hemoglobin is approximately 12-13. The patient is currently at 12.7. We'll contact PCP regarding records. We'll make no interventions at this time.   #  DVT prophylaxis warfarin  INR 1.89, but holding coumadin for possible extraction   LOS: 1 day   WILDMAN-TOBRINER, BEN 09/07/2011, 8:31 AM

## 2011-09-08 LAB — BASIC METABOLIC PANEL
CO2: 27 mEq/L (ref 19–32)
Calcium: 9.1 mg/dL (ref 8.4–10.5)
Creatinine, Ser: 2.68 mg/dL — ABNORMAL HIGH (ref 0.50–1.10)
GFR calc non Af Amer: 18 mL/min — ABNORMAL LOW (ref >90)
Sodium: 133 mEq/L — ABNORMAL LOW (ref 135–145)

## 2011-09-08 LAB — CBC
MCH: 27.9 pg (ref 26.0–34.0)
MCHC: 30.3 g/dL (ref 30.0–36.0)
MCV: 92.2 fL (ref 78.0–100.0)
Platelets: 256 10*3/uL (ref 150–400)
RBC: 4.23 MIL/uL (ref 3.87–5.11)
RDW: 15.7 % — ABNORMAL HIGH (ref 11.5–15.5)

## 2011-09-08 LAB — GLUCOSE, CAPILLARY
Glucose-Capillary: 165 mg/dL — ABNORMAL HIGH (ref 70–99)
Glucose-Capillary: 164 mg/dL — ABNORMAL HIGH (ref 70–99)

## 2011-09-08 LAB — PROTIME-INR: Prothrombin Time: 22 seconds — ABNORMAL HIGH (ref 11.6–15.2)

## 2011-09-08 MED ORDER — HEPARIN SODIUM (PORCINE) 1000 UNIT/ML DIALYSIS
2600.0000 [IU] | Freq: Once | INTRAMUSCULAR | Status: DC
  Filled 2011-09-08: qty 3

## 2011-09-08 MED ORDER — AMOXICILLIN-POT CLAVULANATE 500-125 MG PO TABS: 1.0000 | ORAL_TABLET | Freq: Three times a day (TID) | ORAL | Status: AC

## 2011-09-08 NOTE — Discharge Summary (Signed)
Internal Medicine Teaching [facility] Discharge Note  Name: Kelly Gallegos MRN: [id number] DOB: Mar 05, 1951 61 y.o.  Date of Admission: 09/06/2011  2:34 AM Date of Discharge: 09/08/2011 Attending Physician: Ulyess Mort, MD  Discharge Diagnosis: Principal Problem:  Deep neck cellulitis, likely 2/2 sialoadenitis Active Problems:  End stage renal disease  DM (diabetes mellitus)  Chronic a-fib  CHF (congestive heart failure)  Hypertension  Anemia of chronic disease  Discharge Medications: Medication List  As of 09/08/2011 11:46 AM   STOP taking these medications         amoxicillin-clavulanate 875-125 MG per tablet      warfarin 5 MG tablet         TAKE these medications         amoxicillin-clavulanate 500-125 MG per tablet   Commonly known as: AUGMENTIN   Take 1 tablet (500 mg total) by mouth 3 (three) times daily. Please take in the evening.      calcium carbonate 500 MG chewable tablet   Commonly known as: TUMS - dosed in mg elemental calcium   Chew 1 tablet by mouth 3 (three) times daily before meals. Also before snacks      digoxin 0.125 MG tablet   Commonly known as: LANOXIN   Take 125 mcg by mouth 3 (three) times a week. Monday, Wednesday, Friday      glipiZIDE 5 MG tablet   Commonly known as: GLUCOTROL   Take 5 mg by mouth 2 (two) times daily before a meal.      metoprolol tartrate 25 MG tablet   Commonly known as: LOPRESSOR   Take 25 mg by mouth 2 (two) times daily.      mulitivitamin with minerals Tabs   Take 1 tablet by mouth daily.            Disposition and follow-up:   Kelly Gallegos was discharged from [facility] in Stable condition.  Follow-up Appointments: Follow-up Information    Follow up with Thane Edu, MD on 09/10/2011. (go to [facility])       Follow up with Serena Colonel, MD on 09/15/2011. (10:45am)    Contact information:   [address], [address] [place] [address] [phone]         Discharge Orders    Future Orders Please Complete By Expires   Diet - low sodium heart healthy      Increase activity slowly      Discharge instructions      Comments:   1. Please go to [facility] on Friday 2. Please go to ENT appointment next week      Consultations: Treatment Team:  Maree Krabbe, MD Georgia Lopes, DDS  Procedures Performed:  Dg Orthopantogram  09/06/2011  *RADIOLOGY REPORT*  Clinical Data: Left jaw and facial pain.  ORTHOPANTOGRAM/PANORAMIC  Comparison: CT neck earlier in the day  Findings: Large ovoid  calcification on the left could represent a lymph node or submandibular gland calculus. Dental caries, especially left mandibular and left maxillary premolar/molars. Fragmentary change of what appears to be a right mandibular second or third molar of uncertain significance.    No facial fracture.  IMPRESSION: Mandible intact.  Advanced dental disease.  Large ovoid calcification redemonstrated.  Original Report Authenticated By: Elsie Stain, M.D.   Dg Chest 2 View  09/06/2011  *RADIOLOGY REPORT*  Clinical Data: Jaw and facial pain.  Shortness of breath. Hypertension and diabetes.  CHEST - 2 VIEW  Comparison: 07/23/2011  Findings: Cardiomegaly. Right greater than left pleural effusions with basilar atelectasis.  Dialysis catheter unchanged. No infiltrates or gross failure.  Moderate vascular congestion.  Bones unremarkable.  IMPRESSION: Cardiomegaly with moderate vascular congestion and right greater than left pleural effusions.  Slight worsening aeration compared with February.  Original Report Authenticated By: Elsie Stain, M.D.   Ct Soft Tissue Neck W Contrast  09/08/2011  **ADDENDUM** CREATED: 09/08/2011 09:26:32  The 12 mm calcification described in the second sentence of the findings section at the left floor mouth is felt to reflect a large obstructing sialolith in the setting.  I favor the inflammatory  changes described here are the sequelae of subsequent acute left submandibular gland sialoadenitis.  Case reviewed with Internal Medicine team Resident at the time of this addendum.  **END ADDENDUM** SIGNED BY: Harley Hallmark, M.D.    09/06/2011  *RADIOLOGY REPORT*  Clinical Data: Left-sided facial swelling; difficulty swallowing.  CT NECK WITH CONTRAST  Technique:  Multidetector CT imaging of the neck was performed with intravenous contrast.  Contrast: 75mL OMNIPAQUE IOHEXOL 300 MG/ML  SOLN  Comparison: None.  Findings: There is significant left-sided soft tissue inflammation, tracking along the floor of the mouth, about the left submandibular gland, and along the upper left neck.  Associated soft tissue edema extends about the parapharyngeal soft tissues on the left side, involving the left palatine tonsil and parapharyngeal fat planes, and also resulting in some degree of prevertebral soft tissue edema.  Findings are compatible with deep left-sided cellulitis; no abscess is identified at this time.  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  A large calcification is noted at the left floor of the mouth, of uncertain significance; this may reflect a calcified node.  There is significant narrowing of the hypopharynx, due to left- sided mass effect.  The adenoids and right palatine tonsil are unremarkable in appearance.  Borderline enlarged left cervical nodes are seen.  The parotid glands are within normal limits. There is no definite evidence of vascular compromise. Calcification is noted at the carotid bifurcations bilaterally.  Additional soft tissue edema is noted extending along the anterior upper chest.  Small to moderate right and small left pleural effusions are seen, as on the prior chest radiograph.  The visualized superior mediastinum is grossly unremarkable in appearance.  The expanded portions of the lungs are grossly unremarkable.  The visualized paranasal  sinuses and mastoid air cells are well- aerated.  The orbits are grossly unremarkable in appearance.  The visualized portions of the brain are unremarkable.  There is chronic absence of multiple maxillary and mandibular teeth.  No acute osseous abnormalities are seen.  There is chronic narrowing of the intervertebral disc space at C5-C6.  IMPRESSION:  1.  No evidence of abscess formation at this time. 2.  Significant left-sided soft tissue inflammation, tracking along the floor of the mouth, about the left submandibular gland, and along the upper left neck, with additional soft tissue inflammation extending along the anterior chest wall.  Prevertebral soft tissue edema also noted.  Findings compatible with deep left-sided cellulitis.  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 3.  Narrowing of the hypopharynx due to left-sided mass effect. 4.  Borderline left cervical lymphadenopathy noted. 5.  Large calcification at the left lower the mouth, uncertain significance; this may reflect a calcified node. 6.  Small to moderate right and small left  pleural effusions, as on the prior chest radiograph.  These results were called by telephone on 09/06/2011  at  04:20 a.m. to  Dr. Mancel Bale, who verbally acknowledged these results. Original Report Authenticated By: Harley Hallmark, M.D.    Admission HPI: KRISTIANNE ALBIN is a 61 y.o.female with past medical history significant for afib with RVR, CHF (unknown last ejection fraction), diabetes mellitus (unknown last hemoglobin A1c,) ESRD on HD T, TH, Sat who presents with left sided neck pain and swelling for 5 days.  Oaklee L Pepperman states that on Tuesday she noticed some swelling on the left submandibular area. She pointed this out to Dr. Dorise Hiss on Wednesday and he elected to monitor it. However It kept growing and getting more painful and moving medially until yesterday, when she noted difficulty swallowing and fever to 100. She contacted her  PCP who suggested that she take Augmentin. However she continued to have pain, swelling, and difficulty swallowing her secretions. She then came to the ED on the recommendation of her PCP. She states that she has a throbbing pain in her left jaw.Her last food intake was yesterday when she noted that it was difficult to open her mouth. She states that she ate chicken soup which was difficult for her to swallow. She states that it is difficult for her to swallow her secretions, and endorsed mild difficulty with respirations, but denied shortness of breath.  Denies, N/v/d, constipation, chest pain, SOB   Hospital Course by problem list: # Sialoadenitis  Patient presented with erythema, edema, and dysphasia from the left submandibular region. CT scan of the neck showed deep cellulitis and narrowing of the hypopharynx.  The infection was thought possibly odontogenic in origin versus a sialolith in the submandibular duct. ENT was consulted as well as oral surgery.  The patient was started on IV antibiotics including vancomycin and Unasyn, and responded quite well. By the day after admission her pain, erythema, and edema had improved markedly. She has no airway compromise, and her swelling improved  after antibiotics. After review of the CT with her consultants, it was concluded that her infection is most likely secondary to sialoadenitis. The patient was discharged on Augmentin (renally dosed) and followup was scheduled with ENT for the following week. Could potentially have a removal procedure in the office. She will also followup with [facility] and her PCP Dr. Dorothe Pea for possible tooth extraction. Her coumadin has been held for possible extraction and stone removal   # Chronic a-fib While initially tachycardic, likely secondary to pain infection, her HR was controlled later on in the admission. She was continued on by mouth metoprolol and digoxin. As mentioned, her Coumadin was held in light of pending  procedures.  # End stage renal disease T/Th/S Patient received dialysis in the hospital as scheduled.   Discharge Vitals:  BP 148/82  Pulse 106  Temp(Src) 99.7 F (37.6 C) (Oral)  Resp 21  Ht 5\' 4"  (1.626 m)  Wt 177 lb 4 oz (80.4 kg)  BMI 30.42 kg/m2  SpO2 95%  Discharge Labs:  Results for orders placed during the hospital encounter of 09/06/11 (from the past 24 hour(s))  GLUCOSE, CAPILLARY     Status: Abnormal   Collection Time   09/07/11  2:04 PM      Component Value Range   Glucose-Capillary 112 (*) 70 - 99 (mg/dL)   Comment 1 Documented in Chart     Comment 2 Notify RN    HEMOGLOBIN A1C  Status: Abnormal   Collection Time   09/07/11  3:54 PM      Component Value Range   Hemoglobin A1C 7.2 (*) <5.7 (%)   Mean Plasma Glucose 160 (*) <117 (mg/dL)  GLUCOSE, CAPILLARY     Status: Abnormal   Collection Time   09/07/11  4:53 PM      Component Value Range   Glucose-Capillary 214 (*) 70 - 99 (mg/dL)  GLUCOSE, CAPILLARY     Status: Abnormal   Collection Time   09/07/11  9:32 PM      Component Value Range   Glucose-Capillary 213 (*) 70 - 99 (mg/dL)  PROTIME-INR     Status: Abnormal   Collection Time   09/08/11  5:07 AM      Component Value Range   Prothrombin Time 22.0 (*) 11.6 - 15.2 (seconds)   INR 1.89 (*) 0.00 - 1.49   CBC     Status: Abnormal   Collection Time   09/08/11  5:07 AM      Component Value Range   WBC 11.8 (*) 4.0 - 10.5 (K/uL)   RBC 4.23  3.87 - 5.11 (MIL/uL)   Hemoglobin 11.8 (*) 12.0 - 15.0 (g/dL)   HCT 16.1  09.6 - 04.5 (%)   MCV 92.2  78.0 - 100.0 (fL)   MCH 27.9  26.0 - 34.0 (pg)   MCHC 30.3  30.0 - 36.0 (g/dL)   RDW 40.9 (*) 81.1 - 15.5 (%)   Platelets 256  150 - 400 (K/uL)  BASIC METABOLIC PANEL     Status: Abnormal   Collection Time   09/08/11  5:07 AM      Component Value Range   Sodium 133 (*) 135 - 145 (mEq/L)   Potassium 3.6  3.5 - 5.1 (mEq/L)   Chloride 94 (*) 96 - 112 (mEq/L)   CO2 27  19 - 32 (mEq/L)   Glucose, Bld 168 (*) 70 - 99  (mg/dL)   BUN 31 (*) 6 - 23 (mg/dL)   Creatinine, Ser 9.14 (*) 0.50 - 1.10 (mg/dL)   Calcium 9.1  8.4 - 78.2 (mg/dL)   GFR calc non Af Amer 18 (*) >90 (mL/min)   GFR calc Af Amer 21 (*) >90 (mL/min)  GLUCOSE, CAPILLARY     Status: Abnormal   Collection Time   09/08/11  6:07 AM      Component Value Range   Glucose-Capillary 165 (*) 70 - 99 (mg/dL)   Comment 1 Notify RN    GLUCOSE, CAPILLARY     Status: Abnormal   Collection Time   09/08/11 11:26 AM      Component Value Range   Glucose-Capillary 164 (*) 70 - 99 (mg/dL)   Comment 1 Documented in Chart     Comment 2 Notify RN      SignedAbner Greenspan, BEN 09/08/2011, 11:46 AM

## 2011-09-08 NOTE — Progress Notes (Signed)
ANTICOAGULATION CONSULT NOTE - Follow Up Consult  Pharmacy Consult for Coumadin Indication: atrial fibrillation  Allergies  Allergen Reactions  . Sulfa Antibiotics Other (See Comments)    Unknown-childhood allergy    Patient Measurements: Height: 5\' 4"  (162.6 cm) Weight: 177 lb 4 oz (80.4 kg) (standing scale ) IBW/kg (Calculated) : 54.7    Vital Signs: Temp: 99.7 F (37.6 C) (04/10 0539) Temp src: Oral (04/10 0539) BP: 148/82 mmHg (04/10 0539) Pulse Rate: 106  (04/10 0539)  Labs:  Basename 09/08/11 0507 09/07/11 0903 09/06/11 0616 09/06/11 0319  HGB 11.8* 11.7* -- --  HCT 39.0 37.4 -- 39.1  PLT 256 257 -- 216  APTT -- -- -- --  LABPROT 22.0* 25.9* 22.0* --  INR 1.89* 2.32* 1.89* --  HEPARINUNFRC -- -- -- --  CREATININE 2.68* 3.36* -- 2.38*  CKTOTAL -- -- -- --  CKMB -- -- -- --  TROPONINI -- -- -- --   Estimated Creatinine Clearance: 22.3 ml/min (by C-G formula based on Cr of 2.68).   Medications:  Scheduled:    . ampicillin-sulbactam (UNASYN) IV  3 g Intravenous Q24H  . digoxin  0.125 mg Oral Q M,W,F  . ferric gluconate (FERRLECIT/NULECIT) IV  125 mg Intravenous Q Tue-HD  . heparin  2,600 Units Dialysis Once in dialysis  . insulin aspart  0-5 Units Subcutaneous QHS  . insulin aspart  0-9 Units Subcutaneous TID WC  . insulin glargine  15 Units Subcutaneous QHS  . metoprolol tartrate  25 mg Oral BID  . sodium chloride  3 mL Intravenous Q12H  . vancomycin  1,000 mg Intravenous Q T,Th,Sa-HD  . Warfarin - Pharmacist Dosing Inpatient   Does not apply q1800  . DISCONTD: darbepoetin  25 mcg Intravenous Q Tue-HD    Assessment: 61yo female with 5 day history left sided neck pain and swelling- was given Augmentin by outpatient MD.   Anticoagulation: Patient was on Coumadin for Afib PTA. INR 1.89 without Coumadin. Home regimen was Coumadin 5mg  po daily. H/H stable, WBC improving, being treated with antibiotics per pharmacy.  Dr. Berlinda Last with Internal medicine wishes  to hold coumadin until surgical procedure is determined. Patient is to be discharged today and is going to see ENT one week from today for outpt eval and removal of sialolith (stone) affecting the submandibular gland.   Goal of Therapy:  INR 2-3   Plan:  1) Continue to hold Coumadin per Dr. Berlinda Last and Internal Medicine team  [facility] Pharm D Candidate 09/08/2011,11:59 AM  Reece Leader, Pharm D 09/08/2011 12:55 PM

## 2011-09-08 NOTE — Progress Notes (Signed)
Subjective: No events overnight.  HD yesterday.  Pt feels okay, eating and breathing w/o difficulty.  Objective: Vital signs in last 24 hours: Filed Vitals:   09/07/11 1552 09/07/11 2137 09/07/11 2204 09/08/11 0539  BP: 128/72 115/78 149/83 148/82  Pulse: 111  126 106  Temp: 99 F (37.2 C)  99.3 F (37.4 C) 99.7 F (37.6 C)  TempSrc: Oral  Oral Oral  Resp: 19  18 21   Height:      Weight:    177 lb 4 oz (80.4 kg)  SpO2: 97%  96% 95%   Weight change: 2 lb 13.9 oz (1.3 kg)  Intake/Output Summary (Last 24 hours) at 09/08/11 1102 Last data filed at 09/08/11 0800  Gross per 24 hour  Intake    343 ml  Output   2985 ml  Net  -2642 ml   Physical Exam: Gen: NAD HEENT: improving erythema and edema on L submandiblar region.  Orophaynx w/o exudates.  Still palpable is marked firm, round nodule in L submandibular region.  Lab Results: Basic Metabolic Panel:  Lab 09/08/11 1610 09/07/11 0903  NA 133* 131*  K 3.6 4.3  CL 94* 93*  CO2 27 24  GLUCOSE 168* 195*  BUN 31* 54*  CREATININE 2.68* 3.36*  CALCIUM 9.1 9.5  MG -- --  PHOS -- 6.0*   CBC:  Lab 09/08/11 0507 09/07/11 0903 09/06/11 0319  WBC 11.8* 12.3* --  NEUTROABS -- -- 11.2*  HGB 11.8* 11.7* --  HCT 39.0 37.4 --  MCV 92.2 90.8 --  PLT 256 257 --   CBG:  Lab 09/08/11 0607 09/07/11 2132 09/07/11 1653 09/07/11 1404 09/07/11 0756 09/06/11 2138  GLUCAP 165* 213* 214* 112* 216* 310*   Coagulation:  Lab 09/08/11 0507 09/07/11 0903 09/06/11 0616  LABPROT 22.0* 25.9* 22.0*  INR 1.89* 2.32* 1.89*    Micro Results: Recent Results (from the past 240 hour(s))  CULTURE, BLOOD (ROUTINE X 2)     Status: Normal (Preliminary result)   Collection Time   09/06/11  5:00 AM      Component Value Range Status Comment   Specimen Description BLOOD RIGHT HAND   Final    Special Requests THUMB AREA BOTTLES DRAWN AEROBIC AND ANAEROBIC 5CC   Final    Culture  Setup Time [id number]   Final    Culture     Final    Value:         BLOOD CULTURE RECEIVED NO GROWTH TO DATE CULTURE WILL BE HELD FOR 5 DAYS BEFORE ISSUING A FINAL NEGATIVE REPORT   Report Status PENDING   Incomplete   CULTURE, BLOOD (ROUTINE X 2)     Status: Normal (Preliminary result)   Collection Time   09/06/11  5:10 AM      Component Value Range Status Comment   Specimen Description BLOOD RIGHT HAND   Final    Special Requests TOP BOTTLES DRAWN AEROBIC ONLY [facility]   Final    Culture  Setup Time [id number]   Final    Culture     Final    Value:        BLOOD CULTURE RECEIVED NO GROWTH TO DATE CULTURE WILL BE HELD FOR 5 DAYS BEFORE ISSUING A FINAL NEGATIVE REPORT   Report Status PENDING   Incomplete   MRSA PCR SCREENING     Status: Normal   Collection Time   09/06/11  1:58 PM      Component Value Range Status Comment   MRSA  by PCR NEGATIVE  NEGATIVE  Final    Studies/Results: Dg Orthopantogram  09/06/2011  *RADIOLOGY REPORT*  Clinical Data: Left jaw and facial pain.  ORTHOPANTOGRAM/PANORAMIC  Comparison: CT neck earlier in the day  Findings: Large ovoid  calcification on the left could represent a lymph node or submandibular gland calculus. Dental caries, especially left mandibular and left maxillary premolar/molars. Fragmentary change of what appears to be a right mandibular second or third molar of uncertain significance.    No facial fracture.  IMPRESSION: Mandible intact.  Advanced dental disease.  Large ovoid calcification redemonstrated.  Original Report Authenticated By: Elsie Stain, M.D.   Dg Chest 2 View  09/06/2011  *RADIOLOGY REPORT*  Clinical Data: Jaw and facial pain.  Shortness of breath. Hypertension and diabetes.  CHEST - 2 VIEW  Comparison: 07/23/2011  Findings: Cardiomegaly. Right greater than left pleural effusions with basilar atelectasis.  Dialysis catheter unchanged. No infiltrates or gross failure.  Moderate vascular congestion.  Bones unremarkable.  IMPRESSION: Cardiomegaly with moderate vascular congestion and right greater than left  pleural effusions.  Slight worsening aeration compared with February.  Original Report Authenticated By: Elsie Stain, M.D.   Medications: I have reviewed the patient's current medications. Scheduled Meds:    . ampicillin-sulbactam (UNASYN) IV  3 g Intravenous Q24H  . digoxin  0.125 mg Oral Q M,W,F  . ferric gluconate (FERRLECIT/NULECIT) IV  125 mg Intravenous Q Tue-HD  . heparin  2,600 Units Dialysis Once in dialysis  . insulin aspart  0-5 Units Subcutaneous QHS  . insulin aspart  0-9 Units Subcutaneous TID WC  . insulin glargine  15 Units Subcutaneous QHS  . metoprolol tartrate  25 mg Oral BID  . sodium chloride  3 mL Intravenous Q12H  . vancomycin  1,000 mg Intravenous Q T,Th,Sa-HD  . Warfarin - Pharmacist Dosing Inpatient   Does not apply q1800  . DISCONTD: darbepoetin  25 mcg Intravenous Q Tue-HD   Continuous Infusions:  PRN Meds:.acetaminophen, heparin, morphine  Assessment/Plan: # Sialoadenitis CT re-reviewed with radiology and Dr. Pollyann Kennedy, who agree that pt has large sialolith affecting submandibular gland.  This may represent her source of infection.  Pain, swelling, and erythema are all improved.  Pt taking PO without difficulty.  After d/w ENT Pollyann Kennedy, oral surgeon Sylvester Harder, and PMD Dorothe Pea, plan is to d/c today and see ENT for outpt eval and removal and stone.  Will be on PO abx until then. - ENT one week from today, already arranged - will f/u with [facility] dentist for extraction of tooth - holding coumadin for possible extraction and stone removal  # Chronic a-fib HR better controlled yesterday.  Now back on PO metoprolol and digoxin. - metoprolol 25mg  PO bid - digoxin PO MWF - holding coumadin given pending procedures  # End stage renal disease T/Th/S HD yesterday. BP better contolled now. - appreciate renal consultation - dialysis T/Th/Sa  # DM (diabetes mellitus) A1c 7.2, on glipizide only.  - cont SSI - hold glipizide - consider lantus  # CHF (congestive  heart failure) Unknown last ejection fraction. We'll contact PCP regarding records.   # Hypertension Improved after HD. Will continue metoprolol as above.   # Anemia of chronic disease Baseline Hb 12-13. Hb stable.  # DVT prophylaxis warfarin  INR 1.89, but holding coumadin for possible extraction   LOS: 2 days   WILDMAN-TOBRINER, BEN 09/08/2011, 11:02 AM

## 2011-09-08 NOTE — Progress Notes (Signed)
Subjective:  No new complaints. WBC down.   Objective:    Vital signs in last 24 hours: Filed Vitals:   09/07/11 1552 09/07/11 2137 09/07/11 2204 09/08/11 0539  BP: 128/72 115/78 149/83 148/82  Pulse: 111  126 106  Temp: 99 F (37.2 C)  99.3 F (37.4 C) 99.7 F (37.6 C)  TempSrc: Oral  Oral Oral  Resp: 19  18 21   Height:      Weight:    80.4 kg (177 lb 4 oz)  SpO2: 97%  96% 95%   Weight change: 1.3 kg (2 lb 13.9 oz)  Intake/Output Summary (Last 24 hours) at 09/08/11 1052 Last data filed at 09/08/11 0800  Gross per 24 hour  Intake    343 ml  Output   2985 ml  Net  -2642 ml   Labs: Basic Metabolic Panel:  Lab 09/08/11 1610 09/07/11 0903 09/06/11 0319  NA 133* 131* 132*  K 3.6 4.3 3.7  CL 94* 93* 93*  CO2 27 24 27   GLUCOSE 168* 195* 195*  BUN 31* 54* 30*  CREATININE 2.68* 3.36* 2.38*  ALB -- -- --  CALCIUM 9.1 9.5 9.6  PHOS -- 6.0* --   Liver Function Tests:  Lab 09/07/11 0903  AST --  ALT --  ALKPHOS --  BILITOT --  PROT --  ALBUMIN 3.2*   No results found for this basename: LIPASE:3,AMYLASE:3 in the last 168 hours No results found for this basename: AMMONIA:3 in the last 168 hours CBC:  Lab 09/08/11 0507 09/07/11 0903 09/06/11 0319  WBC 11.8* 12.3* 13.7*  NEUTROABS -- -- 11.2*  HGB 11.8* 11.7* 12.7  HCT 39.0 37.4 39.1  MCV 92.2 90.8 89.7  PLT 256 257 216   Cardiac Enzymes: No results found for this basename: CKTOTAL:5,CKMB:5,CKMBINDEX:5,TROPONINI:5 in the last 168 hours CBG:  Lab 09/08/11 0607 09/07/11 2132 09/07/11 1653 09/07/11 1404 09/07/11 0756  GLUCAP 165* 213* 214* 112* 216*    Iron Studies: No results found for this basename: IRON:30,TIBC:30,SATURATION RATIOS:30,TRANSFERRIN:30,FERRITIN:30 in the last 168 hours Studies/Results: Dg Orthopantogram  09/06/2011  *RADIOLOGY REPORT*  Clinical Data: Left jaw and facial pain.  ORTHOPANTOGRAM/PANORAMIC  Comparison: CT neck earlier in the day  Findings: Large ovoid  calcification on the left could  represent a lymph node or submandibular gland calculus. Dental caries, especially left mandibular and left maxillary premolar/molars. Fragmentary change of what appears to be a right mandibular second or third molar of uncertain significance.    No facial fracture.  IMPRESSION: Mandible intact.  Advanced dental disease.  Large ovoid calcification redemonstrated.  Original Report Authenticated By: Elsie Stain, M.D.   Dg Chest 2 View  09/06/2011  *RADIOLOGY REPORT*  Clinical Data: Jaw and facial pain.  Shortness of breath. Hypertension and diabetes.  CHEST - 2 VIEW  Comparison: 07/23/2011  Findings: Cardiomegaly. Right greater than left pleural effusions with basilar atelectasis.  Dialysis catheter unchanged. No infiltrates or gross failure.  Moderate vascular congestion.  Bones unremarkable.  IMPRESSION: Cardiomegaly with moderate vascular congestion and right greater than left pleural effusions.  Slight worsening aeration compared with February.  Original Report Authenticated By: Elsie Stain, M.D.   Medications:      . ampicillin-sulbactam (UNASYN) IV  3 g Intravenous Q24H  . darbepoetin  25 mcg Intravenous Q Tue-HD  . digoxin  0.125 mg Oral Q M,W,F  . ferric gluconate (FERRLECIT/NULECIT) IV  125 mg Intravenous Q Tue-HD  . insulin aspart  0-5 Units Subcutaneous QHS  . insulin  aspart  0-9 Units Subcutaneous TID WC  . insulin glargine  15 Units Subcutaneous QHS  . metoprolol tartrate  25 mg Oral BID  . sodium chloride  3 mL Intravenous Q12H  . vancomycin  1,000 mg Intravenous Q T,Th,Sa-HD  . Warfarin - Pharmacist Dosing Inpatient   Does not apply q1800    I  have reviewed scheduled and prn medications.  Physical Exam:  Blood pressure 148/82, pulse 106, temperature 99.7 F (37.6 C), temperature source Oral, resp. rate 21, height 5\' 4"  (1.626 m), weight 80.4 kg (177 lb 4 oz), SpO2 95.00%.  Gen: no distress  Neck: no change L neck swelling Resp: clear to auscultation bilaterally  Cardio:  irregularly irregular rhythm, tachycardic  GI: soft, non-tender; bowel sounds normal; no masses, no organomegaly  Extremities: extremities normal, atraumatic, no cyanosis or edema  Neurologic: Grossly normal  Dialysis Access: Right IJ catheter, AVF @ [facility] (revised on 2/22) good bruit  Dialysis Orders: Center: [facility] on TTS  EDW 80.5 kg HD Bath 2K/2.25Ca Time 4 hrs 15 mins Heparin 2600 U. Access Right IJ catheter BFR 400 DFR 800 Zemplar 0 mcg IV/HD Epogen 3000 Units IV/HD Venofer 50 mg qwk    Assessment/Plan 1. Facial/oral infection - prob odontogenic per ENT. IV unasyn and vancomycin 2. ESRD - On HD on TTS, K stable at 3.7. HD tomorrow. At dry weight.  3. Hypertension/volume - BP elevated today, most recently 168/95, on Metoprolol 25 bid as at home.  4. Anemia - Hgb high over 11, hold ESA for now. Continue weekly IV Fe (s/p recent IV Fe 500 mg load x 2 in last 2 mos).  5. Metabolic bone disease - Ca 9.6 / phos 6- continue Tums 1000 ac tid as at home. No vit D.  6. Nutrition - Last Alb 3.7.  7. Chronic atrial fibrillation - on Coumadin, Digoxin, and BB.  8. DM - last Hgb A1c 7.3 on 1/24, on Glipizide 5 mg bid since 2/26.  9. History of necrotizing fasciitis - on back, resolved s/p surgery and wound vac [facility].   Vinson Moselle  MD [employer] [phone] pgr    [phone] cell 09/08/2011, 10:52 AM

## 2011-09-08 NOTE — Progress Notes (Signed)
T max 99.3.  Smiling. Much less pain and swelling. May have large sialolith.  Will try to clarify this and consult ENT about management.

## 2011-09-08 NOTE — Procedures (Unsigned)
VASCULAR LAB EXAM  INDICATION:  Evaluate left basilic vein.  History of left AV fistula.  HISTORY: Diabetes:  Yes. Cardiac:  A fib, congestive heart failure. Hypertension:  Yes.  EXAM: 1. Proximal basilic, upper arm, 0.47 cm. 2. Proximal to mid basilic, upper arm, 0.25 cm/s. 3. Mid basilic, upper arm, 0.30 cm/s. 4. Mid to distal basilic, upper arm, 0.35 cm/s. 5. Distal basilic, upper arm, 0.31 cm/s. 6. Antecubital fossa, basilic, 0.34 cm/s. 7. Mid forearm, basilic, 0.22 cm/s. 8. Distal forearm, wrist area, 0.22 cm/s.  IMPRESSION:  Left basilic vein measurements as shown above.   ___________________________________________ Chuck Hint, M.D.  SS/MEDQ  D:  08/25/2011  T:  08/25/2011  Job:  [id number]

## 2011-09-12 LAB — CULTURE, BLOOD (ROUTINE X 2): Culture: NO GROWTH

## 2011-11-16 ENCOUNTER — Encounter: Payer: Self-pay | Admitting: Vascular Surgery

## 2011-11-17 ENCOUNTER — Encounter: Payer: Self-pay | Admitting: Vascular Surgery

## 2011-11-17 ENCOUNTER — Ambulatory Visit (INDEPENDENT_AMBULATORY_CARE_PROVIDER_SITE_OTHER): Admitting: Vascular Surgery

## 2011-11-17 VITALS — BP 151/76 | HR 95 | Temp 98.6°F | Ht 64.0 in | Wt 180.0 lb

## 2011-11-17 DIAGNOSIS — N186 End stage renal disease: Secondary | ICD-10-CM

## 2011-11-17 NOTE — Progress Notes (Signed)
Vascular and Vein Specialist of [facility]  Patient name: Kelly Gallegos MRN: [id number] DOB: [birth year] Sex: female  REASON FOR VISIT: occluded left upper arm AV fistula  HPI: Kelly Gallegos is a 61 y.o. female who had a left brachiocephalic AV fistula placed in [place]. This was not maturing adequately and she was found to have a long segment stenosis in the proximal fistula. On 07/23/2011 I revise the fistula with a bovine pericardial patch over the stenosis in the fistula. She also had ligation of competing branches. I last saw her on 08/25/2011 which time the fistula was patent. I felt that it would be reasonable to attempt cannulation of the fistula and only consider a basilic vein transposition on the left if this failed. She underwent dialysis approximately 2 weeks ago had a large infiltrate in her left upper arm. Subsequently her fistula occluded. She's here to discuss further options.   REVIEW OF SYSTEMS: Arly.Keller ] denotes positive finding; [  ] denotes negative finding  CARDIOVASCULAR:  [ ]  chest pain   [ ]  dyspnea on exertion    CONSTITUTIONAL:  [ ]  fever   [ ]  chills  PHYSICAL EXAM: Filed Vitals:   11/17/11 0927  BP: 151/76  Pulse: 95  Temp: 98.6 F (37 C)  TempSrc: Oral  Height: 5\' 4"  (1.626 m)  Weight: 180 lb (81.647 kg)  SpO2: 99%   Body mass index is 30.90 kg/(m^2). GENERAL: The patient is a well-nourished female, in no acute distress. The vital signs are documented above. CARDIOVASCULAR: There is a regular rate and rhythm  PULMONARY: There is good air exchange bilaterally without wheezing or rales. Her fistula left upper arm is occluded. I cannot palpate a radial pulse in the right side she does have a palpable ulnar pulse per she has a reasonable size forearm basilic vein on the right. However, this would be difficult access for dialysis.  I did interrogate with the duplex scanner and it appeared that her upper arm cephalic vein and basilic vein on the right arm  were fairly small. Therefore she may not be a candidate for a fistula in the right arm.  MEDICAL ISSUES: She is unable to schedule surgery until July 12. This is a nondialysis day. If the hematoma in her left upper arm has improved significantly we could potentially place a basilic vein transposition in the left arm. Otherwise I think we need to attempt new access in the right arm. Would find adequate vein for a fistula although it is a significant chance that she would require graft on the right. I'll make a decision about which argues when she comes in for surgery on July 12.  [name] S [facility] [place] Beeper: [phone]

## 2011-11-25 ENCOUNTER — Other Ambulatory Visit: Payer: Self-pay | Admitting: *Deleted

## 2011-11-29 ENCOUNTER — Encounter (HOSPITAL_COMMUNITY): Payer: Self-pay | Admitting: Pharmacy Technician

## 2011-12-08 ENCOUNTER — Encounter (HOSPITAL_COMMUNITY): Payer: Self-pay | Admitting: *Deleted

## 2011-12-09 ENCOUNTER — Encounter (HOSPITAL_COMMUNITY): Payer: Self-pay | Admitting: Vascular Surgery

## 2011-12-09 MED ORDER — DEXTROSE 5 % IV SOLN
1.5000 g | INTRAVENOUS | Status: AC
  Administered 2011-12-10: 1.5 g via INTRAVENOUS
  Filled 2011-12-09: qty 1.5

## 2011-12-09 NOTE — Progress Notes (Signed)
Requested Notes, EKG,ECHO and Stress Test from [facility].  Dr. [facility]  Pt. PCP called and requested any office notes ,EKG or any cardiac testing they might have on record.  [phone].

## 2011-12-09 NOTE — Consult Note (Signed)
Anesthesia Chart Review:  Patient is a 61 year old female scheduled for left BVT versus RUE AVF/AVG by Dr. Edilia Bo on 12/10/11.  She is s/p revision of a left AVF on 07/23/11.  She is scheduled to be a same day work-up.  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  PCP is Dr. Modena Jansky at [facility].  His office says they have only seen her once for a GYN exam.  Her last EKG on 09/06/11 showed afib with RVR @ 139 bpm, right axis deviation, cannot rule out anterior infarct (age undetermined).  Records from [facility] were just received this afternoon.   Records indicate that her Cardiologist is Dr. Dellie Burns at [facility].  Patient has not seen him since 09/16/10. His note mentions consideration of cardiac cath if she requires hemodialysis.   Her last echo was done on 11/28/10 and showed LVEF up to 50-55%, moderately dilated LA.  (By notes this was improved from an EF of 10-15% done on 07/04/10.)    Stress test on 07/09/10 showed no evidence for significant ischemia or scar, global systolic function moderately hypokinetic, EF 30-35%, LV/RV were dilated, minimal aortic calcification.    She is for a CXR, labs, and EKG on arrival.    I reviewed above with Anesthesiologist Dr. Chaney Malling.  Since her stress test in February 2012 showed no ischemia or scar, EF had improved on June 2012 echo, and she tolerated a hemodialysis access procedure in February 2013, then anticpate she can proceed if same day diagnostic results are reasonable.    Shonna Chock, PA-C 12/09/11 1525

## 2011-12-09 NOTE — Progress Notes (Signed)
Dr. Dorothe Pea office states that they have only seen the pt. Once and that was for GYN visit .

## 2011-12-10 ENCOUNTER — Telehealth: Payer: Self-pay | Admitting: Vascular Surgery

## 2011-12-10 ENCOUNTER — Ambulatory Visit (HOSPITAL_COMMUNITY)
Admission: RE | Admit: 2011-12-10 | Discharge: 2011-12-10 | Disposition: A | Discharge status: Home / Self Care | Payer: Medicare (Managed Care) | Source: Ambulatory Visit | Attending: Vascular Surgery | Admitting: Vascular Surgery

## 2011-12-10 ENCOUNTER — Encounter (HOSPITAL_COMMUNITY): Payer: Self-pay | Admitting: Vascular Surgery

## 2011-12-10 ENCOUNTER — Ambulatory Visit (HOSPITAL_COMMUNITY): Payer: Medicare (Managed Care) | Admitting: Vascular Surgery

## 2011-12-10 ENCOUNTER — Encounter (HOSPITAL_COMMUNITY): Payer: Self-pay | Admitting: *Deleted

## 2011-12-10 ENCOUNTER — Encounter (HOSPITAL_COMMUNITY)
Admission: RE | Disposition: A | Discharge status: Home / Self Care | Payer: Self-pay | Source: Ambulatory Visit | Attending: Vascular Surgery

## 2011-12-10 ENCOUNTER — Ambulatory Visit (HOSPITAL_COMMUNITY): Payer: Medicare (Managed Care)

## 2011-12-10 DIAGNOSIS — N186 End stage renal disease: Secondary | ICD-10-CM

## 2011-12-10 DIAGNOSIS — I4891 Unspecified atrial fibrillation: Secondary | ICD-10-CM | POA: Insufficient documentation

## 2011-12-10 DIAGNOSIS — I129 Hypertensive chronic kidney disease with stage 1 through stage 4 chronic kidney disease, or unspecified chronic kidney disease: Secondary | ICD-10-CM | POA: Insufficient documentation

## 2011-12-10 DIAGNOSIS — M1990 Unspecified osteoarthritis, unspecified site: Secondary | ICD-10-CM | POA: Insufficient documentation

## 2011-12-10 DIAGNOSIS — N189 Chronic kidney disease, unspecified: Secondary | ICD-10-CM | POA: Insufficient documentation

## 2011-12-10 HISTORY — DX: Other pulmonary embolism without acute cor pulmonale: I26.99

## 2011-12-10 HISTORY — DX: Pneumonia, unspecified organism: J18.9

## 2011-12-10 HISTORY — PX: AV FISTULA PLACEMENT: SHX1204

## 2011-12-10 HISTORY — DX: Acute embolism and thrombosis of unspecified deep veins of unspecified lower extremity: I82.409

## 2011-12-10 LAB — APTT: aPTT: 31 seconds (ref 24–37)

## 2011-12-10 LAB — PROTIME-INR: INR: 1.18 (ref 0.00–1.49)

## 2011-12-10 LAB — POCT I-STAT 4, (NA,K, GLUC, HGB,HCT)
Glucose, Bld: 211 mg/dL — ABNORMAL HIGH (ref 70–99)
HCT: 39 % (ref 36.0–46.0)
Potassium: 4.1 mEq/L (ref 3.5–5.1)

## 2011-12-10 LAB — GLUCOSE, CAPILLARY: Glucose-Capillary: 182 mg/dL — ABNORMAL HIGH (ref 70–99)

## 2011-12-10 SURGERY — INSERTION OF ARTERIOVENOUS (AV) GORE-TEX GRAFT ARM
Anesthesia: Monitor Anesthesia Care | Site: Arm Upper | Laterality: Right | Wound class: Clean

## 2011-12-10 MED ORDER — THROMBIN 20000 UNITS EX KIT
PACK | CUTANEOUS | Status: DC | PRN
  Administered 2011-12-10: 09:00:00 via TOPICAL

## 2011-12-10 MED ORDER — ACETAMINOPHEN 10 MG/ML IV SOLN
INTRAVENOUS | Status: AC
Start: 2011-12-10 — End: 2011-12-10
  Filled 2011-12-10: qty 100

## 2011-12-10 MED ORDER — ONDANSETRON HCL 4 MG/2ML IJ SOLN: 4.0000 mg | Freq: Once | INTRAMUSCULAR | Status: DC | PRN

## 2011-12-10 MED ORDER — PROTAMINE SULFATE 10 MG/ML IV SOLN
INTRAVENOUS | Status: DC | PRN
  Administered 2011-12-10: 10 mg via INTRAVENOUS
  Administered 2011-12-10: 20 mg via INTRAVENOUS

## 2011-12-10 MED ORDER — HYDROMORPHONE HCL PF 1 MG/ML IJ SOLN: 0.2500 mg | INTRAMUSCULAR | Status: DC | PRN

## 2011-12-10 MED ORDER — DEXTROSE 5 % IV SOLN
INTRAVENOUS | Status: DC | PRN
  Administered 2011-12-10: 08:00:00 via INTRAVENOUS

## 2011-12-10 MED ORDER — SODIUM CHLORIDE 0.9 % IR SOLN
Status: DC | PRN
  Administered 2011-12-10: 08:00:00

## 2011-12-10 MED ORDER — SODIUM CHLORIDE 0.9 % IV SOLN: INTRAVENOUS | Status: DC

## 2011-12-10 MED ORDER — HEPARIN SODIUM (PORCINE) 1000 UNIT/ML IJ SOLN
INTRAMUSCULAR | Status: DC | PRN
  Administered 2011-12-10: 6000 [IU] via INTRAVENOUS

## 2011-12-10 MED ORDER — PROPOFOL 10 MG/ML IV EMUL
INTRAVENOUS | Status: DC | PRN
  Administered 2011-12-10: 30 mL via INTRAVENOUS

## 2011-12-10 MED ORDER — ACETAMINOPHEN 10 MG/ML IV SOLN
INTRAVENOUS | Status: DC | PRN
  Administered 2011-12-10: 1000 mg via INTRAVENOUS

## 2011-12-10 MED ORDER — LIDOCAINE HCL (PF) 1 % IJ SOLN
INTRAMUSCULAR | Status: AC
  Filled 2011-12-10: qty 30

## 2011-12-10 MED ORDER — LIDOCAINE HCL (PF) 1 % IJ SOLN
INTRAMUSCULAR | Status: DC | PRN
  Administered 2011-12-10: 30 mL via INTRADERMAL

## 2011-12-10 MED ORDER — MUPIROCIN 2 % EX OINT
TOPICAL_OINTMENT | CUTANEOUS | Status: AC
  Administered 2011-12-10: 1 via NASAL
  Filled 2011-12-10: qty 22

## 2011-12-10 MED ORDER — PROPOFOL 10 MG/ML IV EMUL
INTRAVENOUS | Status: DC | PRN
  Administered 2011-12-10: 100 ug/kg/min via INTRAVENOUS

## 2011-12-10 MED ORDER — MUPIROCIN 2 % EX OINT
TOPICAL_OINTMENT | Freq: Two times a day (BID) | CUTANEOUS | Status: DC
  Filled 2011-12-10: qty 22

## 2011-12-10 MED ORDER — FENTANYL CITRATE 0.05 MG/ML IJ SOLN
INTRAMUSCULAR | Status: DC | PRN
  Administered 2011-12-10: 50 ug via INTRAVENOUS
  Administered 2011-12-10: 25 ug via INTRAVENOUS
  Administered 2011-12-10: 50 ug via INTRAVENOUS

## 2011-12-10 MED ORDER — THROMBIN 20000 UNITS EX SOLR
CUTANEOUS | Status: AC
  Filled 2011-12-10: qty 20000

## 2011-12-10 MED ORDER — MIDAZOLAM HCL 5 MG/5ML IJ SOLN
INTRAMUSCULAR | Status: DC | PRN
  Administered 2011-12-10: 1 mg via INTRAVENOUS

## 2011-12-10 MED ORDER — 0.9 % SODIUM CHLORIDE (POUR BTL) OPTIME
TOPICAL | Status: DC | PRN
  Administered 2011-12-10: 1000 mL

## 2011-12-10 MED ORDER — OXYCODONE HCL 5 MG PO TABS: 5.0000 mg | ORAL_TABLET | ORAL | Status: AC | PRN

## 2011-12-10 MED ORDER — LIDOCAINE HCL (CARDIAC) 20 MG/ML IV SOLN
INTRAVENOUS | Status: DC | PRN
  Administered 2011-12-10: 50 mg via INTRAVENOUS

## 2011-12-10 MED ORDER — SODIUM CHLORIDE 0.9 % IV SOLN
INTRAVENOUS | Status: DC | PRN
  Administered 2011-12-10: 07:00:00 via INTRAVENOUS

## 2011-12-10 SURGICAL SUPPLY — 43 items
CANISTER SUCTION 2500CC (MISCELLANEOUS) ×3 IMPLANT
CLIP TI MEDIUM 6 (CLIP) ×3 IMPLANT
CLIP TI WIDE RED SMALL 6 (CLIP) ×6 IMPLANT
CLOTH BEACON ORANGE TIMEOUT ST (SAFETY) ×3 IMPLANT
COVER PROBE W GEL 5X96 (DRAPES) IMPLANT
COVER SURGICAL LIGHT HANDLE (MISCELLANEOUS) ×3 IMPLANT
DECANTER SPIKE VIAL GLASS SM (MISCELLANEOUS) ×3 IMPLANT
DERMABOND ADHESIVE PROPEN (GAUZE/BANDAGES/DRESSINGS) ×1
DERMABOND ADVANCED (GAUZE/BANDAGES/DRESSINGS) ×1
DERMABOND ADVANCED .7 DNX12 (GAUZE/BANDAGES/DRESSINGS) ×2 IMPLANT
DERMABOND ADVANCED .7 DNX6 (GAUZE/BANDAGES/DRESSINGS) ×2 IMPLANT
DRAIN PENROSE 1/2X12 LTX STRL (WOUND CARE) IMPLANT
ELECT REM PT RETURN 9FT ADLT (ELECTROSURGICAL) ×3
ELECTRODE REM PT RTRN 9FT ADLT (ELECTROSURGICAL) ×2 IMPLANT
GEL ULTRASOUND 20GR AQUASONIC (MISCELLANEOUS) ×3 IMPLANT
GLOVE BIO SURGEON STRL SZ 6.5 (GLOVE) ×3 IMPLANT
GLOVE BIO SURGEON STRL SZ7.5 (GLOVE) ×3 IMPLANT
GLOVE BIOGEL PI IND STRL 7.0 (GLOVE) ×6 IMPLANT
GLOVE BIOGEL PI IND STRL 7.5 (GLOVE) ×6 IMPLANT
GLOVE BIOGEL PI INDICATOR 7.0 (GLOVE) ×3
GLOVE BIOGEL PI INDICATOR 7.5 (GLOVE) ×3
GLOVE ECLIPSE 6.5 STRL STRAW (GLOVE) ×3 IMPLANT
GLOVE ECLIPSE 7.0 STRL STRAW (GLOVE) ×3 IMPLANT
GLOVE SURG SS PI 7.5 STRL IVOR (GLOVE) ×6 IMPLANT
GOWN PREVENTION PLUS XLARGE (GOWN DISPOSABLE) ×6 IMPLANT
GOWN STRL NON-REIN LRG LVL3 (GOWN DISPOSABLE) ×9 IMPLANT
GRAFT GORETEX STRT 4-7X45 (Vascular Products) ×3 IMPLANT
KIT BASIN OR (CUSTOM PROCEDURE TRAY) ×3 IMPLANT
KIT ROOM TURNOVER OR (KITS) ×3 IMPLANT
LOOP VESSEL MINI RED (MISCELLANEOUS) ×3 IMPLANT
NS IRRIG 1000ML POUR BTL (IV SOLUTION) ×3 IMPLANT
PACK CV ACCESS (CUSTOM PROCEDURE TRAY) ×3 IMPLANT
PAD ARMBOARD 7.5X6 YLW CONV (MISCELLANEOUS) ×6 IMPLANT
SPONGE GAUZE 4X4 12PLY (GAUZE/BANDAGES/DRESSINGS) ×3 IMPLANT
SPONGE SURGIFOAM ABS GEL 100 (HEMOSTASIS) IMPLANT
SUT PROLENE 6 0 BV (SUTURE) ×12 IMPLANT
SUT VIC AB 3-0 SH 27 (SUTURE) ×2
SUT VIC AB 3-0 SH 27X BRD (SUTURE) ×4 IMPLANT
SUT VICRYL 4-0 PS2 18IN ABS (SUTURE) ×6 IMPLANT
TOWEL OR 17X24 6PK STRL BLUE (TOWEL DISPOSABLE) ×3 IMPLANT
TOWEL OR 17X26 10 PK STRL BLUE (TOWEL DISPOSABLE) ×3 IMPLANT
UNDERPAD 30X30 INCONTINENT (UNDERPADS AND DIAPERS) ×3 IMPLANT
WATER STERILE IRR 1000ML POUR (IV SOLUTION) ×3 IMPLANT

## 2011-12-10 NOTE — Progress Notes (Signed)
Spoke with Kelly Gallegos renal pa,notifed of pt surgery,location and K=4.1,instruct pt to return to  Dialysis tomorrow at regular time and pt instructed

## 2011-12-10 NOTE — Progress Notes (Signed)
Iv nurse here deaccessed dialysis cath and flushed

## 2011-12-10 NOTE — Telephone Encounter (Signed)
Sent letter 12/22/11 @ 10:20am

## 2011-12-10 NOTE — Telephone Encounter (Signed)
Message copied by Fredrich Birks on Fri Dec 10, 2011  4:41 PM ------      Message from: Phillips Odor      Created: Fri Dec 10, 2011  1:11 PM      Regarding: FW: charge and follow up                   ----- Message -----         From: Chuck Hint, MD         Sent: 12/10/2011   9:51 AM           To: Reuel Derby, Erenest Blank, RN      Subject: charge and follow up                                     PROCEDURE: New right forearm AV graft            SURGEON: Di Kindle. Edilia Bo, MD, FACS            ASSIST: Della Goo PA            She has very thin skin and I would like to see her in 2 weeks to check on her incisions. She could be seen by Rusty.

## 2011-12-10 NOTE — Progress Notes (Signed)
Patient ID: Kelly Gallegos, female   DOB: [birth year], 61 y.o.   MRN: [id number] Note: Kelly Gallegos is a participant of [facility]. I am her PCP here. Our number is [phone].  We can also assist with discharge planning if needed. Thanks.

## 2011-12-10 NOTE — Interval H&P Note (Signed)
History and Physical Interval Note:  12/10/2011 7:24 AM  Kelly Gallegos  has presented today for surgery, with the diagnosis of ESRD  The various methods of treatment have been discussed with the patient and family. After consideration of risks, benefits and other options for treatment, the patient has consented [id number] VEIN TRANSPOSITION (Left) OR RIGHT ARM ARTERIOVENOUS GRAFT.  The patient's history has been reviewed, patient examined, no change in status, stable for surgery.  I have reviewed the patients' chart and labs.  Questions were answered to the patient's satisfaction.     DICKSON,CHRISTOPHER S

## 2011-12-10 NOTE — Anesthesia Preprocedure Evaluation (Addendum)
Anesthesia Evaluation  Patient identified by MRN, date of birth, ID band Patient awake    Reviewed: Allergy & Precautions, H&P , NPO status , Patient's Chart, lab work & pertinent test results  Airway Mallampati: II TM Distance: >3 FB Neck ROM: Full    Dental  (+) Teeth Intact and Dental Advisory Given,    Pulmonary neg pulmonary ROS, neg pneumonia -, former smoker,   DG Chest 2 View   Status: Final result       PACS Images     Show images for DG Chest 2 View      Study Result     *RADIOLOGY REPORT*   Clinical Data: Preoperative chest radiograph for AVG revision. History of smoking.   CHEST - 2 VIEW   Comparison: Chest radiograph performed 09/06/2011   Findings: There is a persistent small to moderate right-sided pleural effusion, with associated atelectasis.  Previously noted vascular congestion has improved.  The left lung appears relatively clear.  No pneumothorax is identified.   The cardiomediastinal silhouette is borderline enlarged.  A right- sided dual catheter is noted ending about the cavoatrial junction. No acute osseous abnormalities are identified.   IMPRESSION:   1.  Persistent small to moderate right-sided pleural effusion, with associated atelectasis. 2.  Improved vascular congestion and borderline cardiomegaly noted.   Original Report Authenticated By: Tonia Ghent, M.D.       External Result Report     External Result Report       Imaging     Imaging Information       Signed by       Signed Date/Time   Phone Pager    Tonia Ghent 12/10/2011  6:29 AM EDT [phone]      Exam Information       Status Exam Begun   Exam Ended      Final (99) 12/10/2011  6:16 AM EDT 12/10/2011  6:24 AM EDT          Original Order       Ordered On Ordered By      Caleen Essex Dec 10, 2011 6:11 AM Estella Husk, RN                DG Chest 2 View (Order [id number])  Imaging  :  [id number]   Authorizing: Aubery Lapping, MD  Department: [facility]   Date: 12/10/2011  Released By: Bonney Roussel, RN        Order Information       Order Date/Time Release Date/Time Start Date/Time End Date/Time    12/10/2011  6:11 AM 12/10/2011  6:11 AM 12/10/2011  6:12 AM 12/10/2011  6:12 AM              Order Details       Frequency Duration Priority Order Class    1 time imaging 1  occurrence Routine Hospital Performed              Imaging CC Recipients           Collection Information       Resulting Agency    [facility]               Order Provider Info         Office phone Pager/beeper E-mail    Ordering User Estella Husk, RN [phone] -- [email or web address]    Authorizing Provider Aubery Lapping, MD [phone] -- --    Attending Provider Chuck Hint,  MD [phone] -- [email or web address]    Billing Provider Tonia Ghent, MD [phone] --      Order-Level Documents:     There are no order-level documents.      Reprint Requisition     DG Chest 2 View [phone]) on 12/10/11      Original Order       Ordered On Ordered By      Fri Dec 10, 2011 6:11 AM Estella Husk, RN             breath sounds clear to auscultation  Pulmonary exam normal       Cardiovascular hypertension, Pt. on medications and Pt. 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  Renal/GU      Musculoskeletal  (+) Arthritis -, Osteoarthritis,    Abdominal   Peds  Hematology   Anesthesia Other Findings Quit smoking 1.5 years ago   Reproductive/Obstetrics                        Anesthesia Physical Anesthesia Plan  ASA: III  Anesthesia Plan: MAC and General   Post-op Pain Management:    Induction:  Intravenous  Airway Management Planned: LMA and Nasal Cannula  Additional Equipment:   Intra-op Plan:   Post-operative Plan: Extubation in OR  Informed Consent: I have reviewed the patients History and Physical, chart, labs and discussed the procedure including the risks, benefits and alternatives for the proposed anesthesia with the patient or authorized representative who has indicated his/her understanding and acceptance.   Dental advisory given  Plan Discussed with: CRNA and Surgeon  Anesthesia Plan Comments:        Anesthesia Quick Evaluation

## 2011-12-10 NOTE — Anesthesia Postprocedure Evaluation (Signed)
Anesthesia Post Note  Patient: Kelly Gallegos  Procedure(s) Performed: Procedure(s) (LRB): INSERTION OF ARTERIOVENOUS (AV) GORE-TEX GRAFT ARM (Right)  Anesthesia type: general  Patient location: PACU  Post pain: Pain level controlled  Post assessment: Patient's Cardiovascular Status Stable  Last Vitals:  Filed Vitals:   12/10/11 1015  BP: 121/68  Pulse: 80  Temp:   Resp: 16    Post vital signs: Reviewed and stable  Level of consciousness: sedated  Complications: No apparent anesthesia complications

## 2011-12-10 NOTE — Progress Notes (Signed)
Good thrill and bruit right fistula

## 2011-12-10 NOTE — Preoperative (Signed)
Beta Blockers   Pt took Lopressor @ 04:30 12/10/11

## 2011-12-10 NOTE — Op Note (Signed)
NAME: [name]   MRN: [id number] DOB: [birth year]    DATE OF OPERATION: 12/10/2011  PREOP DIAGNOSIS: Chronic kidney disease  POSTOP DIAGNOSIS: Same  PROCEDURE: New right forearm AV graft  SURGEON: Di Kindle. Edilia Bo, MD, FACS  ASSIST: Della Goo PA  ANESTHESIA: local with sedation   EBL: minimal  INDICATIONS: Kelly Gallegos is a 61 y.o. female who comes in for elective placement of a right arm AV graft.  FINDINGS: she was not a candidate for fistula although it appeared that her forearm basilic vein might be reasonable. For this reason I explored this however the vein was simply too small. A right forearm graft was placed with the arterial aspect of the graft on the lateral aspect of the forearm.  TECHNIQUE: The patient was brought to the operating room and sedated by anesthesia. The right upper extremity was prepped and draped in the usual sterile fashion. After the skin was anesthetized with 1% lidocaine, an incision was made over the basilic vein in the forearm. This vein was too small to use as a fistula. It was therefore ligated. A separate longitudinal incision was made just above the antecubital level after the skin was anesthetized. The brachial artery and adjacent brachial vein were dissected free. The brachial vein was ligated distally and took a 4 mm dilator. I elected to place a forearm graft. Using the distal incision a 4-7 mm graft was tunneled in a loop fashion in the forearm with the arterial aspect of the graft on the lateral aspect of the forearm. The patient was heparinized. The brachial artery was clamped proximally and distally and a longitudinal arteriotomy was made. A segment of the 4 mm end of the graft was excised, the graft slightly spatulated, and sewn in end to side to the brachial artery using continuous 6-0 Prolene suture. The graft was then pulled to the appropriate length for anastomosis to the brachial vein. The vein was ligated distally and  spatulated proximally. The graft was cut to the appropriate length spatulated and sewn into into the vein using continuous 6-0 Prolene suture. At completion there was a good thrill in the fistula and an excellent ulnar signal with the Doppler and a monophasic radial signal which was the same as preop. The heparin was partially reversed with protamine. The 2 wounds were closed with a pair of 3-0 Vicryl and the skin closed with 4-0 Vicryl. Dermabond was applied. The patient tolerated the procedure well and was transferred to the recovery room in stable condition. All needle and sponge counts were correct.  Waverly Ferrari, MD, FACS [facility] of [facility]  DATE OF DICTATION:   12/10/2011

## 2011-12-10 NOTE — H&P (View-Only) (Signed)
Vascular and Vein Specialist of [place]  Patient name: Kelly Gallegos MRN: [id number] DOB: 07/28/1950 Sex: female  REASON FOR VISIT: occluded left upper arm AV fistula  HPI: Kelly Gallegos is a 61 y.o. female who had a left brachiocephalic AV fistula placed in [place]. This was not maturing adequately and she was found to have a long segment stenosis in the proximal fistula. On 07/23/2011 I revise the fistula with a bovine pericardial patch over the stenosis in the fistula. She also had ligation of competing branches. I last saw her on 08/25/2011 which time the fistula was patent. I felt that it would be reasonable to attempt cannulation of the fistula and only consider a basilic vein transposition on the left if this failed. She underwent dialysis approximately 2 weeks ago had a large infiltrate in her left upper arm. Subsequently her fistula occluded. She's here to discuss further options.   REVIEW OF SYSTEMS: [X ] denotes positive finding; [  ] denotes negative finding  CARDIOVASCULAR:  [ ] chest pain   [ ] dyspnea on exertion    CONSTITUTIONAL:  [ ] fever   [ ] chills  PHYSICAL EXAM: Filed Vitals:   11/17/11 0927  BP: 151/76  Pulse: 95  Temp: 98.6 F (37 C)  TempSrc: Oral  Height: 5' 4" (1.626 m)  Weight: 180 lb (81.647 kg)  SpO2: 99%   Body mass index is 30.90 kg/(m^2). GENERAL: The patient is a well-nourished female, in no acute distress. The vital signs are documented above. CARDIOVASCULAR: There is a regular rate and rhythm  PULMONARY: There is good air exchange bilaterally without wheezing or rales. Her fistula left upper arm is occluded. I cannot palpate a radial pulse in the right side she does have a palpable ulnar pulse per she has a reasonable size forearm basilic vein on the right. However, this would be difficult access for dialysis.  I did interrogate with the duplex scanner and it appeared that her upper arm cephalic vein and basilic vein on the right arm  were fairly small. Therefore she may not be a candidate for a fistula in the right arm.  MEDICAL ISSUES: She is unable to schedule surgery until July 12. This is a nondialysis day. If the hematoma in her left upper arm has improved significantly we could potentially place a basilic vein transposition in the left arm. Otherwise I think we need to attempt new access in the right arm. Would find adequate vein for a fistula although it is a significant chance that she would require graft on the right. I'll make a decision about which argues when she comes in for surgery on July 12.  Kelly Gallegos S [facility] of [place] Beeper: [phone]     

## 2011-12-10 NOTE — Transfer of Care (Signed)
Immediate Anesthesia Transfer of Care Note  Patient: Kelly Gallegos  Procedure(s) Performed: Procedure(s) (LRB): INSERTION OF ARTERIOVENOUS (AV) GORE-TEX GRAFT ARM (Right)  Patient Location: PACU  Anesthesia Type: MAC  Level of Consciousness: awake, alert , oriented and patient cooperative  Airway & Oxygen Therapy: Patient Spontanous Breathing and Patient connected to nasal cannula oxygen  Post-op Assessment: Report given to PACU RN and Post -op Vital signs reviewed and stable  Post vital signs: Reviewed and stable  Complications: No apparent anesthesia complications

## 2011-12-13 ENCOUNTER — Encounter (HOSPITAL_COMMUNITY): Payer: Self-pay | Admitting: Vascular Surgery

## 2011-12-21 ENCOUNTER — Encounter: Payer: Self-pay | Admitting: Neurosurgery

## 2011-12-22 ENCOUNTER — Encounter: Payer: Self-pay | Admitting: Neurosurgery

## 2011-12-22 ENCOUNTER — Ambulatory Visit (INDEPENDENT_AMBULATORY_CARE_PROVIDER_SITE_OTHER): Payer: Medicare (Managed Care) | Admitting: Neurosurgery

## 2011-12-22 VITALS — BP 141/70 | HR 75 | Resp 16 | Ht 64.0 in | Wt 175.0 lb

## 2011-12-22 DIAGNOSIS — N186 End stage renal disease: Secondary | ICD-10-CM

## 2011-12-22 NOTE — Progress Notes (Signed)
Subjective:     Patient ID: Kelly Gallegos, female   DOB: [birth year], 61 y.o.   MRN: [id number]  HPI: 61 year old female patient seen by Dr. Edilia Bo for creation of elective placement of right arm AV graft. Dr. Edilia Bo and the patient to come back for recheck do to thinning skin and he wanted to make sure that the patient healed properly. Patient denies any problems and states it feels much better now than it did the day after surgery. The patient has no other complaints.   Review of Systems: 12 point review of systems is notable for the difficulties described above otherwise unremarkable     Objective:   Physical Exam: Patient's afebrile, vital signs are stable, incision wounds are well healed in the right arm. There is a palpable thrill throughout the graft.     Assessment:     2 weeks status post creation of right arm AV fistula. Well-healed, the patient will be able to allow hemodialysis access this in about 2 more weeks. Until then they will use her right upper chest catheter.    Plan:     After 2 successful cannulations in the graft hemodialysis will notify our office to have the catheter removed, the patient will followup here when necessary basis. The patient's questions were encouraged and answered, she is in agreement with this plan.  Lauree Chandler ANP  Clinic M.D.: Edilia Bo

## 2012-01-14 ENCOUNTER — Other Ambulatory Visit (HOSPITAL_COMMUNITY): Payer: Self-pay | Admitting: Nephrology

## 2012-01-14 DIAGNOSIS — N186 End stage renal disease: Secondary | ICD-10-CM

## 2012-01-19 ENCOUNTER — Ambulatory Visit (HOSPITAL_COMMUNITY)
Admission: RE | Admit: 2012-01-19 | Discharge: 2012-01-19 | Disposition: A | Discharge status: Home / Self Care | Payer: Medicare (Managed Care) | Source: Ambulatory Visit | Attending: Nephrology | Admitting: Nephrology

## 2012-01-19 DIAGNOSIS — N186 End stage renal disease: Secondary | ICD-10-CM | POA: Insufficient documentation

## 2012-01-19 DIAGNOSIS — Z452 Encounter for adjustment and management of vascular access device: Secondary | ICD-10-CM | POA: Insufficient documentation

## 2012-01-19 MED ORDER — CHLORHEXIDINE GLUCONATE 4 % EX LIQD
CUTANEOUS | Status: AC
  Filled 2012-01-19: qty 30

## 2012-01-19 NOTE — Procedures (Signed)
Interventional Radiology Procedure Note  Procedure: Removal of right IJ permacath Complications:  None Recommendations: - Bandage on x 48 hrs - After removal, may shower normally.  Do not submerge for 7 days  Signed,  Sterling Big, MD Vascular & Interventional Radiologist [facility] Radiology

## 2012-02-25 ENCOUNTER — Other Ambulatory Visit: Payer: Self-pay | Admitting: Physician Assistant

## 2012-03-13 ENCOUNTER — Other Ambulatory Visit (HOSPITAL_COMMUNITY): Payer: Self-pay | Admitting: Nephrology

## 2012-03-13 DIAGNOSIS — N186 End stage renal disease: Secondary | ICD-10-CM

## 2012-03-17 ENCOUNTER — Other Ambulatory Visit (HOSPITAL_COMMUNITY): Payer: Self-pay | Admitting: Nephrology

## 2012-03-17 ENCOUNTER — Ambulatory Visit (HOSPITAL_COMMUNITY)
Admission: RE | Admit: 2012-03-17 | Discharge: 2012-03-17 | Disposition: A | Discharge status: Home / Self Care | Payer: Medicare (Managed Care) | Source: Ambulatory Visit | Attending: Nephrology | Admitting: Nephrology

## 2012-03-17 DIAGNOSIS — T82898A Other specified complication of vascular prosthetic devices, implants and grafts, initial encounter: Secondary | ICD-10-CM | POA: Insufficient documentation

## 2012-03-17 DIAGNOSIS — N186 End stage renal disease: Secondary | ICD-10-CM | POA: Insufficient documentation

## 2012-03-17 DIAGNOSIS — Y849 Medical procedure, unspecified as the cause of abnormal reaction of the patient, or of later complication, without mention of misadventure at the time of the procedure: Secondary | ICD-10-CM | POA: Insufficient documentation

## 2012-03-17 MED ORDER — IOHEXOL 300 MG/ML  SOLN
100.0000 mL | Freq: Once | INTRAMUSCULAR | Status: AC | PRN
  Administered 2012-03-17: 60 mL via INTRAVENOUS

## 2012-03-17 NOTE — Procedures (Signed)
Technically successful fistulogram with angioplasty.  No immediate complications.   

## 2012-03-20 ENCOUNTER — Telehealth (HOSPITAL_COMMUNITY): Payer: Self-pay | Admitting: *Deleted

## 2012-03-20 NOTE — Telephone Encounter (Signed)
Radiology post procedure phone call.  Pt reports doing well, fistula working well, had dialysis Saturday, no problems or questions.

## 2012-06-02 ENCOUNTER — Other Ambulatory Visit (HOSPITAL_COMMUNITY): Payer: Self-pay | Admitting: Nephrology

## 2012-06-02 DIAGNOSIS — N186 End stage renal disease: Secondary | ICD-10-CM

## 2012-06-05 ENCOUNTER — Ambulatory Visit (HOSPITAL_COMMUNITY)
Admission: RE | Admit: 2012-06-05 | Discharge: 2012-06-05 | Disposition: A | Discharge status: Home / Self Care | Payer: Medicare (Managed Care) | Source: Ambulatory Visit | Attending: Nephrology | Admitting: Nephrology

## 2012-06-05 ENCOUNTER — Other Ambulatory Visit (HOSPITAL_COMMUNITY): Payer: Self-pay | Admitting: Nephrology

## 2012-06-05 DIAGNOSIS — I120 Hypertensive chronic kidney disease with stage 5 chronic kidney disease or end stage renal disease: Secondary | ICD-10-CM | POA: Insufficient documentation

## 2012-06-05 DIAGNOSIS — N186 End stage renal disease: Secondary | ICD-10-CM | POA: Insufficient documentation

## 2012-06-05 DIAGNOSIS — Y832 Surgical operation with anastomosis, bypass or graft as the cause of abnormal reaction of the patient, or of later complication, without mention of misadventure at the time of the procedure: Secondary | ICD-10-CM | POA: Insufficient documentation

## 2012-06-05 DIAGNOSIS — E119 Type 2 diabetes mellitus without complications: Secondary | ICD-10-CM | POA: Insufficient documentation

## 2012-06-05 DIAGNOSIS — I871 Compression of vein: Secondary | ICD-10-CM | POA: Insufficient documentation

## 2012-06-05 DIAGNOSIS — T82898A Other specified complication of vascular prosthetic devices, implants and grafts, initial encounter: Secondary | ICD-10-CM | POA: Insufficient documentation

## 2012-06-05 MED ORDER — IOHEXOL 300 MG/ML  SOLN
100.0000 mL | Freq: Once | INTRAMUSCULAR | Status: AC | PRN
  Administered 2012-06-05: 50 mL via INTRAVENOUS

## 2012-06-05 NOTE — Procedures (Signed)
Shuntogram, venous PTA No complication No blood loss. See complete dictation in [facility].

## 2012-06-05 NOTE — H&P (Signed)
Kelly Gallegos is an 62 y.o. female.   Chief Complaint: poor flows during dialysis HPI: Previous PTA of venous anastamotic stenosis of RUA HD graft. Now recurrent near-occlusive stenosis.  Past Medical History  Diagnosis Date  . Diabetes mellitus     Type II  . Hypertension   . Atrial fibrillation   . Anemia     Anemia of chronic disease  . Thyroid disease     Secondary Hyperparathyroidism  . Necrotizing fasciitis 2012  . CHF (congestive heart failure)   . Shortness of breath   . Chronic kidney disease     T TH SAT [facility]  . Pneumonia 2010  . DVT (deep venous thrombosis) approx 1999  . Pulmonary embolus approx 1999  . Cardiomyopathy     presumed non-ischemic due to no ischemia on stress 07/2010 [facility])    Past Surgical History  Procedure Date  . Av fistula placement 02/10/2011    Left Brachiocephalic Arteriovenous Fistula Formation  . Back surgery 10-2010    pt. 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 Avf revision 07-23-2011    left  . Salivary gland surgery     stone removed  . Cesarean section   . Av fistula placement 12/10/2011    Procedure: INSERTION OF ARTERIOVENOUS (AV) GORE-TEX GRAFT ARM;  Surgeon: Chuck Hint, MD;  Location: [facility] OR;  Service: Vascular;  Laterality: Right;    Family History  Problem Relation Age of Onset  . Anesthesia problems Neg Hx   . Hypotension Neg Hx   . Malignant hyperthermia Neg Hx   . Pseudochol deficiency Neg Hx   . Cancer Mother   . Heart disease Father   . Cancer Sister    Social History:  reports that she quit smoking about 2 years ago. Her smoking use included Cigarettes. She quit after 20 years of use. She has never used smokeless tobacco. She reports that she does not drink alcohol or use illicit drugs.  Allergies:  Allergies  Allergen Reactions  . Sulfa Antibiotics Other (See Comments)    Unknown-childhood allergy         ROS Tolerated previous anesthesia. No  CVA, MI, CABG. Off Coumadin since Friday There were no vitals taken for this visit. Physical Exam  Constitutional: She is not intubated.  Cardiovascular: Normal rate and regular rhythm.   Respiratory: Breath sounds normal. No accessory muscle usage. No apnea, not tachypneic and not bradypneic. She is not intubated. No respiratory distress.  Skin: Skin is warm and dry. No cyanosis.     Assessment/Plan Venous PTA recurrent stenosis.  Kelly Gallegos,Kelly Gallegos Kelly Gallegos 06/05/2012, 1:24 PM

## 2012-08-08 ENCOUNTER — Other Ambulatory Visit (HOSPITAL_COMMUNITY): Payer: Self-pay | Admitting: Nephrology

## 2012-08-08 DIAGNOSIS — Z992 Dependence on renal dialysis: Secondary | ICD-10-CM

## 2012-08-09 ENCOUNTER — Ambulatory Visit (HOSPITAL_COMMUNITY)
Admission: RE | Admit: 2012-08-09 | Discharge: 2012-08-09 | Disposition: A | Discharge status: Home / Self Care | Payer: Medicare (Managed Care) | Source: Ambulatory Visit | Attending: Nephrology | Admitting: Nephrology

## 2012-08-09 ENCOUNTER — Other Ambulatory Visit (HOSPITAL_COMMUNITY): Payer: Self-pay | Admitting: Nephrology

## 2012-08-09 DIAGNOSIS — T82898A Other specified complication of vascular prosthetic devices, implants and grafts, initial encounter: Secondary | ICD-10-CM | POA: Insufficient documentation

## 2012-08-09 DIAGNOSIS — I871 Compression of vein: Secondary | ICD-10-CM | POA: Insufficient documentation

## 2012-08-09 DIAGNOSIS — Y832 Surgical operation with anastomosis, bypass or graft as the cause of abnormal reaction of the patient, or of later complication, without mention of misadventure at the time of the procedure: Secondary | ICD-10-CM | POA: Insufficient documentation

## 2012-08-09 DIAGNOSIS — Z992 Dependence on renal dialysis: Secondary | ICD-10-CM | POA: Insufficient documentation

## 2012-08-09 DIAGNOSIS — N186 End stage renal disease: Secondary | ICD-10-CM

## 2012-08-09 MED ORDER — IOHEXOL 300 MG/ML  SOLN
100.0000 mL | Freq: Once | INTRAMUSCULAR | Status: AC | PRN
  Administered 2012-08-09: 70 mL via INTRAVENOUS

## 2012-08-09 NOTE — Procedures (Signed)
Technically successful fistulogram with angioplasty.  No immediate complications.   

## 2012-11-10 ENCOUNTER — Other Ambulatory Visit (HOSPITAL_COMMUNITY): Payer: Self-pay | Admitting: Nephrology

## 2012-11-10 DIAGNOSIS — N186 End stage renal disease: Secondary | ICD-10-CM

## 2012-11-15 ENCOUNTER — Ambulatory Visit (HOSPITAL_COMMUNITY)
Admission: RE | Admit: 2012-11-15 | Discharge: 2012-11-15 | Disposition: A | Discharge status: Home / Self Care | Payer: Medicare (Managed Care) | Source: Ambulatory Visit | Attending: Nephrology | Admitting: Nephrology

## 2012-11-15 ENCOUNTER — Other Ambulatory Visit (HOSPITAL_COMMUNITY): Payer: Self-pay | Admitting: Family Medicine

## 2012-11-15 ENCOUNTER — Ambulatory Visit (HOSPITAL_COMMUNITY)
Admission: RE | Admit: 2012-11-15 | Discharge: 2012-11-15 | Disposition: A | Discharge status: Home / Self Care | Payer: Medicare (Managed Care) | Source: Ambulatory Visit | Attending: Family Medicine | Admitting: Family Medicine

## 2012-11-15 ENCOUNTER — Other Ambulatory Visit (HOSPITAL_COMMUNITY): Payer: Self-pay | Admitting: Nephrology

## 2012-11-15 DIAGNOSIS — N186 End stage renal disease: Secondary | ICD-10-CM | POA: Insufficient documentation

## 2012-11-15 DIAGNOSIS — I4891 Unspecified atrial fibrillation: Secondary | ICD-10-CM | POA: Insufficient documentation

## 2012-11-15 DIAGNOSIS — T82898A Other specified complication of vascular prosthetic devices, implants and grafts, initial encounter: Secondary | ICD-10-CM | POA: Insufficient documentation

## 2012-11-15 DIAGNOSIS — N2581 Secondary hyperparathyroidism of renal origin: Secondary | ICD-10-CM | POA: Insufficient documentation

## 2012-11-15 DIAGNOSIS — R52 Pain, unspecified: Secondary | ICD-10-CM

## 2012-11-15 DIAGNOSIS — Z8701 Personal history of pneumonia (recurrent): Secondary | ICD-10-CM | POA: Insufficient documentation

## 2012-11-15 DIAGNOSIS — Z8673 Personal history of transient ischemic attack (TIA), and cerebral infarction without residual deficits: Secondary | ICD-10-CM | POA: Insufficient documentation

## 2012-11-15 DIAGNOSIS — I509 Heart failure, unspecified: Secondary | ICD-10-CM | POA: Insufficient documentation

## 2012-11-15 DIAGNOSIS — D638 Anemia in other chronic diseases classified elsewhere: Secondary | ICD-10-CM | POA: Insufficient documentation

## 2012-11-15 DIAGNOSIS — I871 Compression of vein: Secondary | ICD-10-CM | POA: Insufficient documentation

## 2012-11-15 DIAGNOSIS — Y832 Surgical operation with anastomosis, bypass or graft as the cause of abnormal reaction of the patient, or of later complication, without mention of misadventure at the time of the procedure: Secondary | ICD-10-CM | POA: Insufficient documentation

## 2012-11-15 DIAGNOSIS — Z882 Allergy status to sulfonamides status: Secondary | ICD-10-CM | POA: Insufficient documentation

## 2012-11-15 DIAGNOSIS — Z87891 Personal history of nicotine dependence: Secondary | ICD-10-CM | POA: Insufficient documentation

## 2012-11-15 DIAGNOSIS — Z86718 Personal history of other venous thrombosis and embolism: Secondary | ICD-10-CM | POA: Insufficient documentation

## 2012-11-15 DIAGNOSIS — E119 Type 2 diabetes mellitus without complications: Secondary | ICD-10-CM | POA: Insufficient documentation

## 2012-11-15 DIAGNOSIS — I428 Other cardiomyopathies: Secondary | ICD-10-CM | POA: Insufficient documentation

## 2012-11-15 DIAGNOSIS — Z79899 Other long term (current) drug therapy: Secondary | ICD-10-CM | POA: Insufficient documentation

## 2012-11-15 DIAGNOSIS — Z7901 Long term (current) use of anticoagulants: Secondary | ICD-10-CM | POA: Insufficient documentation

## 2012-11-15 MED ORDER — IOHEXOL 300 MG/ML  SOLN
100.0000 mL | Freq: Once | INTRAMUSCULAR | Status: AC | PRN
  Administered 2012-11-15: 50 mL via INTRAVENOUS

## 2012-11-15 NOTE — Procedures (Signed)
Successful balloon angioplasty of the right upper arm outflow vein with a 6 mm balloon.  No immediate complication.

## 2012-11-15 NOTE — H&P (Signed)
Kelly Gallegos is an 62 y.o. female.   Chief Complaint: Decreased flows from right arm shunt HPI: ESRD with history of venous stenosis and angioplasty in right upper arm outflow vein.  Shuntogram today again demonstrates irregularity of the upper arm outflow vein.    Past Medical History  Diagnosis Date  . Diabetes mellitus     Type II  . Hypertension   . Atrial fibrillation   . Anemia     Anemia of chronic disease  . Thyroid disease     Secondary Hyperparathyroidism  . Necrotizing fasciitis 2012  . CHF (congestive heart failure)   . Shortness of breath   . Chronic kidney disease     T TH SAT Kelly Gallegos  . Pneumonia 2010  . DVT (deep venous thrombosis) approx 1999  . Pulmonary embolus approx 1999  . Cardiomyopathy     presumed non-ischemic due to no ischemia on stress 07/2010 [facility])    Past Surgical History  Procedure Laterality Date  . Av fistula placement  02/10/2011    Left Brachiocephalic Arteriovenous Fistula Formation  . Back surgery  10-2010    pt. 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 Avf revision  07-23-2011    left  . Salivary gland surgery      stone removed  . Cesarean section    . Av fistula placement  12/10/2011    Procedure: INSERTION OF ARTERIOVENOUS (AV) GORE-TEX GRAFT ARM;  Surgeon: Chuck Hint, MD;  Location: [facility] OR;  Service: Vascular;  Laterality: Right;    Family History  Problem Relation Age of Onset  . Anesthesia problems Neg Hx   . Hypotension Neg Hx   . Malignant hyperthermia Neg Hx   . Pseudochol deficiency Neg Hx   . Cancer Mother   . Heart disease Father   . Cancer Sister    Social History:  reports that she quit smoking about 2 years ago. Her smoking use included Cigarettes. She smoked 0.00 packs per day for 20 years. She has never used smokeless tobacco. She reports that she does not drink alcohol or use illicit drugs.  Allergies:  Allergies  Allergen Reactions  . Sulfa  Antibiotics Other (See Comments)    Unknown-childhood allergy    Current Outpatient Prescriptions on File Prior to Encounter  Medication Sig Dispense Refill  . calcium carbonate (TUMS - DOSED IN MG ELEMENTAL CALCIUM) 500 MG chewable tablet Chew 1 tablet by mouth 3 (three) times daily before meals. Also before snacks      . digoxin (LANOXIN) 0.125 MG tablet Take 125 mcg by mouth 3 (three) times a week. Monday, Wednesday, Friday      . folic acid-vitamin b complex-vitamin c-selenium-zinc (DIALYVITE) 3 MG TABS Take 1 tablet by mouth daily.      Marland Kitchen glipiZIDE (GLUCOTROL) 5 MG tablet Take 5 mg by mouth 2 (two) times daily before a meal.      . metoprolol tartrate (LOPRESSOR) 25 MG tablet Take 25 mg by mouth 2 (two) times daily.      . Multiple Vitamin (MULITIVITAMIN WITH MINERALS) TABS Take 1 tablet by mouth daily.      Marland Kitchen warfarin (COUMADIN) 5 MG tablet Take 5 mg by mouth. One on Tue, Wed, Fri, Sat, Sun P.M.      . warfarin (COUMADIN) 7.5 MG tablet Take 7.5 mg by mouth daily. One on Monday and Thursday P.M.       No current facility-administered medications  on file prior to encounter.    Dg Chest 2 View  11/15/2012   *RADIOLOGY REPORT*  Clinical Data: Post dialysis one day ago, crackles in lungs, history end-stage renal disease, hypertension, diabetes, CHF  CHEST - 2 VIEW  Comparison: 12/10/2011  Findings: Enlargement of cardiac silhouette with pulmonary vascular congestion. Persistent right basilar effusion and atelectasis. Minimal left pleural effusion. No definite pulmonary infiltrate or pneumothorax. Previously seen right jugular line removed.  IMPRESSION: Enlargement of cardiac silhouette with pulmonary vascular congestion. Persistent bibasilar effusions right larger than left with mild right basilar atelectasis.   Original Report Authenticated By: Ulyses Southward, M.D.    Review of Systems  Constitutional: Negative.   Respiratory: Negative.   Cardiovascular: Negative.   Gastrointestinal:  Negative.     Physical Exam  Cardiovascular:  Irregular heart beat consistent with atrial fibrillation.   Respiratory: Effort normal and breath sounds normal.  GI: Soft. Bowel sounds are normal.     Assessment/Plan Right upper extremity shuntogram demonstrates irregularity of the upper arm outflow vein.  Findings may explain decreased access flows.  Discussed balloon angioplasty with patient and informed consent was obtained.  Plan for additional shuntogram images and probable balloon angioplasty without sedation.    [name] 11/15/2012, 1:43 PM

## 2013-03-26 IMAGING — XA IR SHUNTOGRAM/ FISTULAGRAM *R*
1 series · 12 of 24 positions shown · non-contrast
Comparison: None - this is the first shuntogram for this arm graft

INDICATION: Decreased flows at dialysis

DIALYSIS AV SHUNTOGRAM/FISTULAGRAM
VENOUS ANGIOPLASTY x1

[Series 1: run · 12 of 63 slices shown]
[im 3/63]
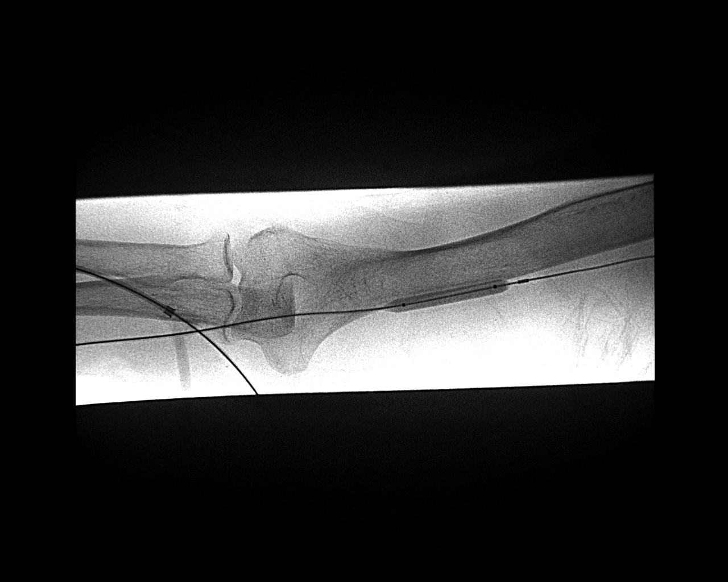
[im 9/63]
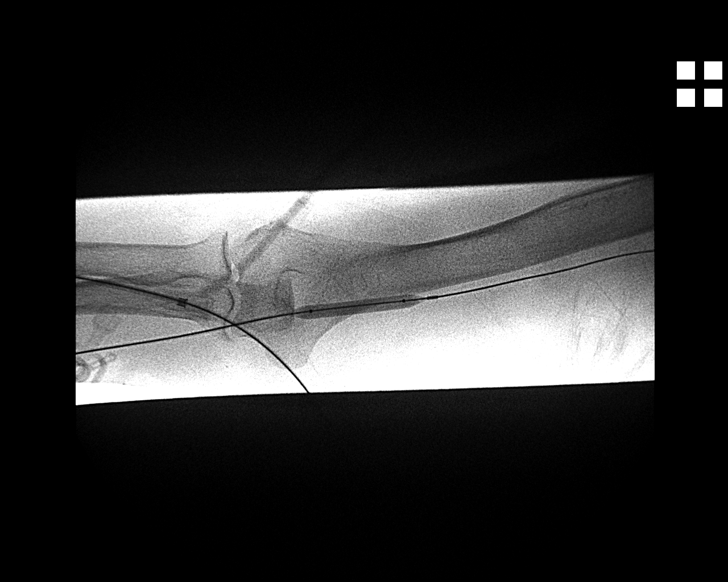
[im 14/63]
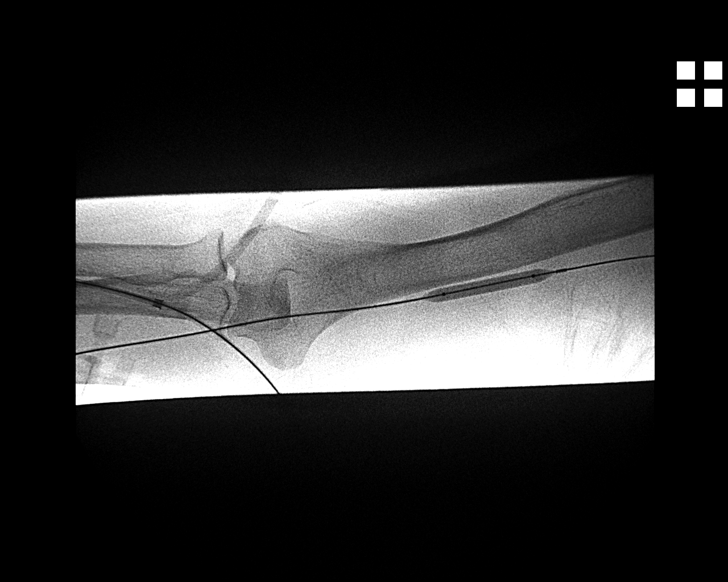
[im 19/63]
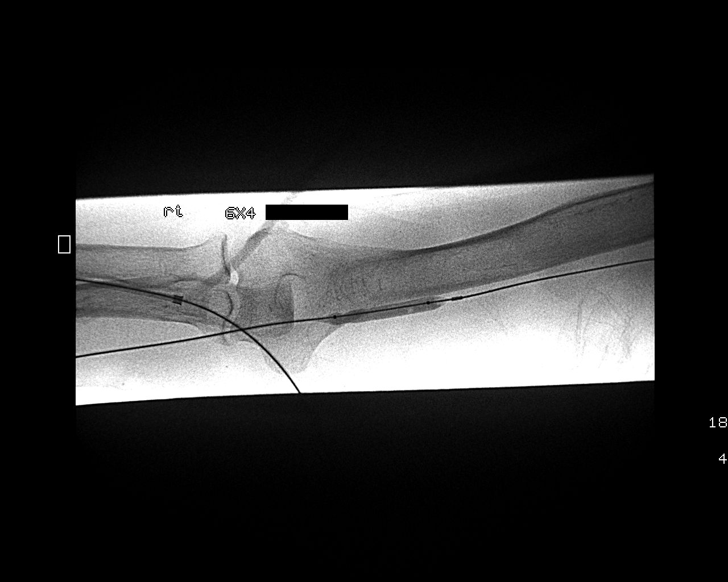
[im 25/63]
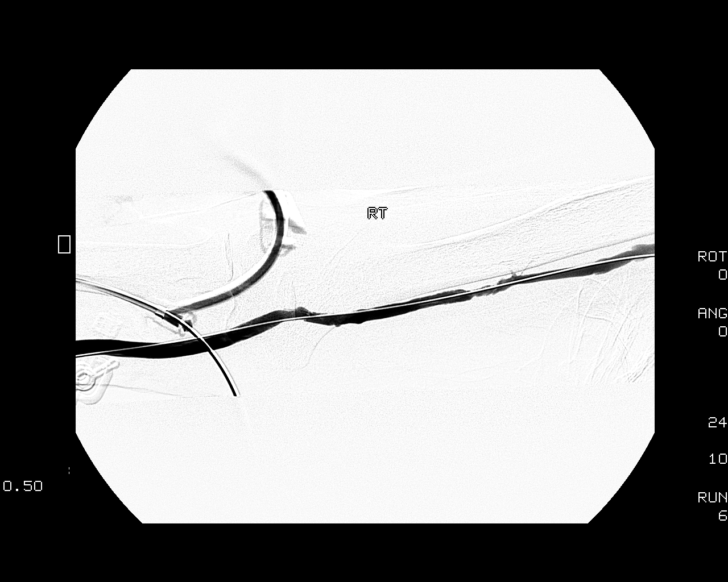
[im 30/63]
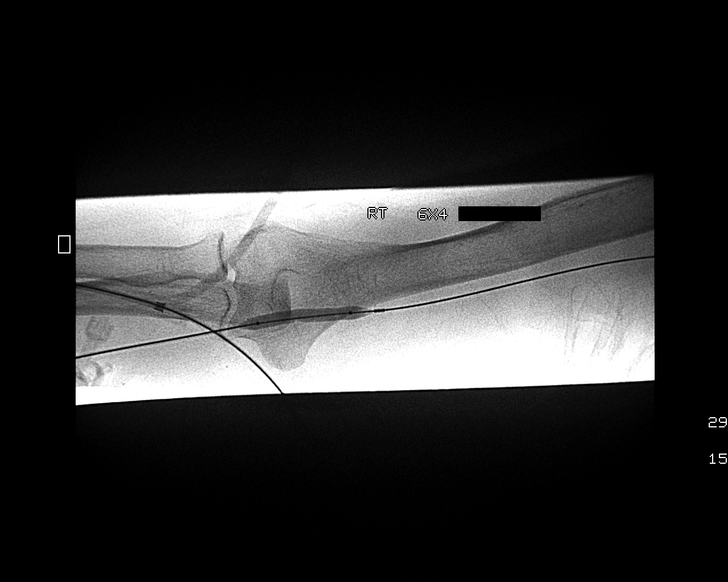
[im 36/63]
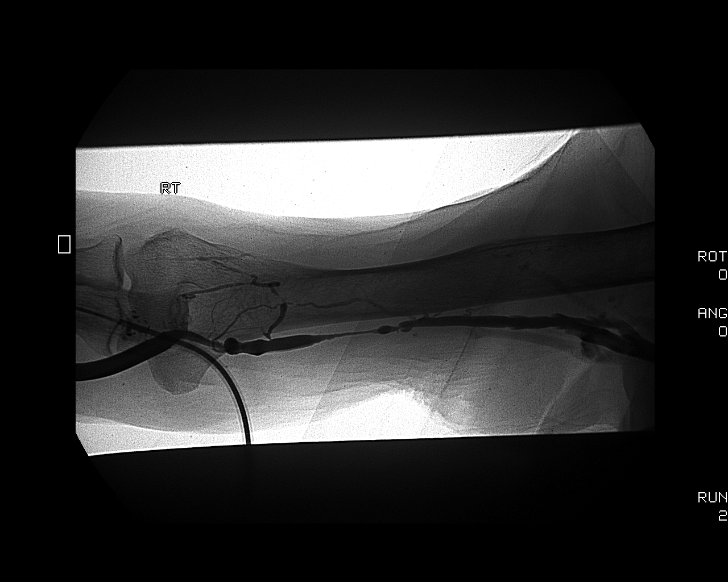
[im 41/63]
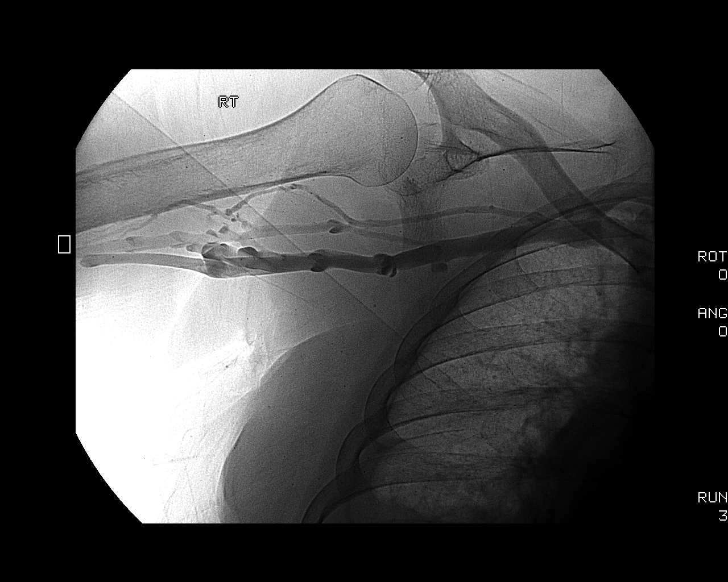
[im 46/63]
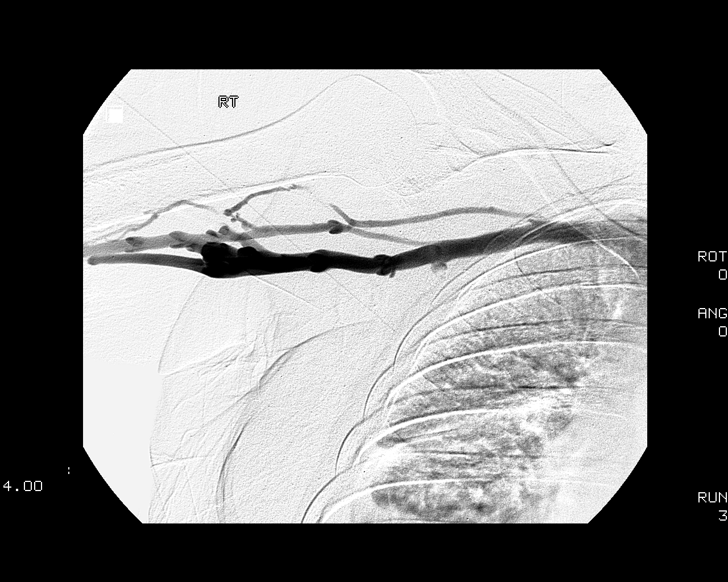
[im 52/63]
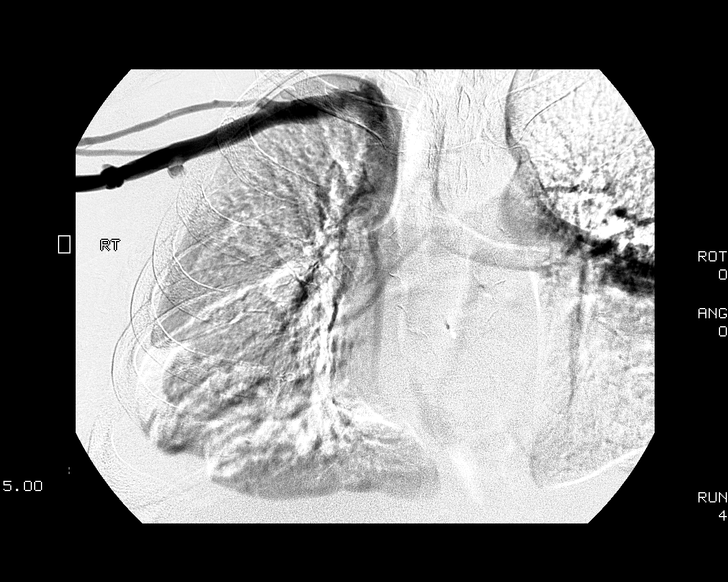
[im 57/63]
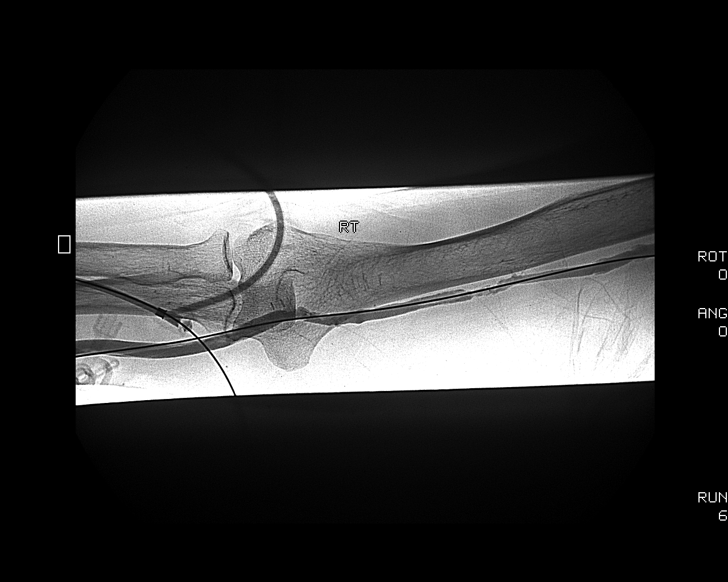
[im 63/63]
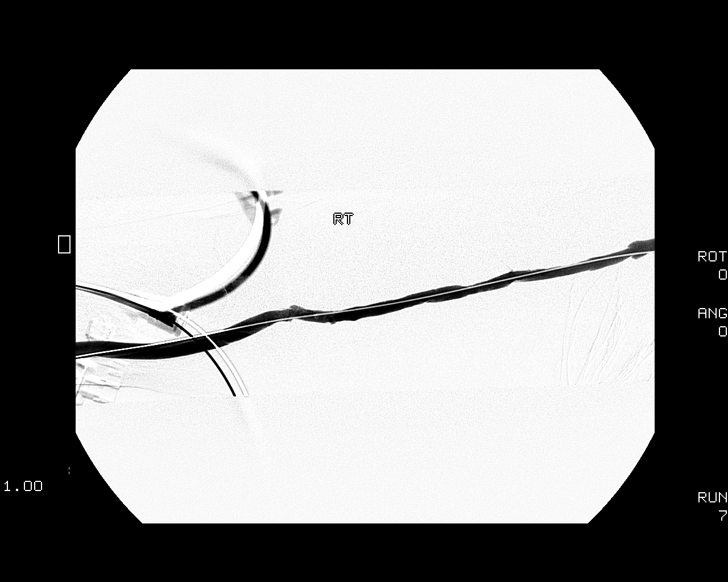

[12 of 24 positions shown; findings below may reference images not displayed]

Intravenous medications: None

Contrast: 60 ml [id number]

Fluoroscopy Time: 3.7 minutes

Complications: None immediate

Procedure:

Informed written consent was obtained from the patient after a
discussion of the risk, benefits and alternatives to treatment.
Questions regarding the procedure were encouraged and answered.  A
timeout was performed prior to the initiation of the procedure.

The skin overlying the right forearm dialysis graft was prepped
with Betadine in a sterile fashion, and a sterile drape was applied
covering the operative field.  A diagnostic shunt study was
performed via an 18 gauge angiocatheter introduced into venous
outflow.  Venous drainage was assessed to the level of the central
veins in the chest.  Proximal shunt was studied by reflux maneuver
with temporary compression of venous outflow.

The angiocath was removed and replaced with a 6-French sheath over
a guidewire.  Balloon angioplasty of the venous anastomosis and mid
humeral aspect of the draining brachial vein was angioplastied in
multiple stations with a 6 mm x 4 cm Conquest balloon.  A
completion shuntogram was performed.

The sheath was removed and hemostasis obtained with application of
a 2-0 Ethilon pursestring suture which will be removed at the
patient's next dialysis session. A dressing was placed.  The
patient tolerated the procedure well without immediate post
procedural complication.
FINDINGS: The right forearm synthetic AV graft is widely patent.

There is a moderate length moderate irregular narrowing of the
venous anastomosis as well as a moderate length severe narrowing of
the mid/distal humeral aspect of the draining brachial veins which
were successfully treated with prolonged balloon angioplasty to 6
mm diameter.  Completion shuntogram demonstrates an excellent
angiographic result with improved flow through the graft and
draining brachial vein.  Completion shuntogram was negative for
complication, specifically, no evidence of vessel dissection or
contrast intravasation.  The remainder of the venous limb is widely
patent.

The right upper extremity central venous system is widely patent.

Reflex shuntogram demonstrates wide patency of the arterial limb
and anastomoses.
IMPRESSION: Successful balloon angioplasty of the venous anastomosis and mid
humeral aspect of the draining brachial vein to 6 mm diameter.

Access Management:

This access remains amenable to future percutaneous interventions
as clinically indicated.

## 2013-04-17 ENCOUNTER — Other Ambulatory Visit (HOSPITAL_COMMUNITY): Payer: Self-pay | Admitting: Nephrology

## 2013-04-17 DIAGNOSIS — N186 End stage renal disease: Secondary | ICD-10-CM

## 2013-04-20 ENCOUNTER — Ambulatory Visit (HOSPITAL_COMMUNITY)
Admission: RE | Admit: 2013-04-20 | Discharge: 2013-04-20 | Disposition: A | Discharge status: Home / Self Care | Payer: Medicare (Managed Care) | Source: Ambulatory Visit | Attending: Nephrology | Admitting: Nephrology

## 2013-04-20 ENCOUNTER — Other Ambulatory Visit (HOSPITAL_COMMUNITY): Payer: Self-pay | Admitting: Nephrology

## 2013-04-20 DIAGNOSIS — Z992 Dependence on renal dialysis: Secondary | ICD-10-CM | POA: Insufficient documentation

## 2013-04-20 DIAGNOSIS — N186 End stage renal disease: Secondary | ICD-10-CM

## 2013-04-20 DIAGNOSIS — T82898A Other specified complication of vascular prosthetic devices, implants and grafts, initial encounter: Secondary | ICD-10-CM | POA: Insufficient documentation

## 2013-04-20 DIAGNOSIS — Y832 Surgical operation with anastomosis, bypass or graft as the cause of abnormal reaction of the patient, or of later complication, without mention of misadventure at the time of the procedure: Secondary | ICD-10-CM | POA: Insufficient documentation

## 2013-04-20 DIAGNOSIS — I871 Compression of vein: Secondary | ICD-10-CM | POA: Insufficient documentation

## 2013-04-20 MED ORDER — IOHEXOL 300 MG/ML  SOLN
100.0000 mL | Freq: Once | INTRAMUSCULAR | Status: AC | PRN
  Administered 2013-04-20: 40 mL via INTRAVENOUS

## 2013-04-20 NOTE — Procedures (Signed)
Successful rt fa avg shuntogram with 6mm pta No comp Stable Ready for use

## 2013-08-07 ENCOUNTER — Other Ambulatory Visit (HOSPITAL_COMMUNITY): Payer: Self-pay | Admitting: Nephrology

## 2013-08-07 DIAGNOSIS — N186 End stage renal disease: Secondary | ICD-10-CM

## 2013-08-08 ENCOUNTER — Ambulatory Visit (HOSPITAL_COMMUNITY)
Admission: RE | Admit: 2013-08-08 | Discharge: 2013-08-08 | Disposition: A | Discharge status: Home / Self Care | Payer: Medicare (Managed Care) | Source: Ambulatory Visit | Attending: Nephrology | Admitting: Nephrology

## 2013-08-08 ENCOUNTER — Other Ambulatory Visit (HOSPITAL_COMMUNITY): Payer: Self-pay | Admitting: Nephrology

## 2013-08-08 DIAGNOSIS — N186 End stage renal disease: Secondary | ICD-10-CM

## 2013-08-08 DIAGNOSIS — Y832 Surgical operation with anastomosis, bypass or graft as the cause of abnormal reaction of the patient, or of later complication, without mention of misadventure at the time of the procedure: Secondary | ICD-10-CM | POA: Insufficient documentation

## 2013-08-08 DIAGNOSIS — T82898A Other specified complication of vascular prosthetic devices, implants and grafts, initial encounter: Secondary | ICD-10-CM | POA: Insufficient documentation

## 2013-08-08 DIAGNOSIS — I871 Compression of vein: Secondary | ICD-10-CM | POA: Insufficient documentation

## 2013-08-08 MED ORDER — IOHEXOL 300 MG/ML  SOLN
100.0000 mL | Freq: Once | INTRAMUSCULAR | Status: AC | PRN
  Administered 2013-08-08: 50 mL via INTRAVENOUS

## 2013-08-08 NOTE — Procedures (Signed)
SHuntogram, venous 2mm PTA No complication No blood loss. See complete dictation in [facility].

## 2013-08-09 ENCOUNTER — Telehealth (HOSPITAL_COMMUNITY): Payer: Self-pay | Admitting: *Deleted

## 2014-02-18 ENCOUNTER — Other Ambulatory Visit (HOSPITAL_COMMUNITY): Payer: Self-pay | Admitting: Nephrology

## 2014-02-18 DIAGNOSIS — N186 End stage renal disease: Secondary | ICD-10-CM

## 2014-02-18 DIAGNOSIS — Z992 Dependence on renal dialysis: Principal | ICD-10-CM

## 2014-02-20 ENCOUNTER — Ambulatory Visit (HOSPITAL_COMMUNITY)
Admission: RE | Admit: 2014-02-20 | Discharge: 2014-02-20 | Disposition: A | Discharge status: Home / Self Care | Payer: Medicare (Managed Care) | Source: Ambulatory Visit | Attending: Nephrology | Admitting: Nephrology

## 2014-02-20 ENCOUNTER — Other Ambulatory Visit (HOSPITAL_COMMUNITY): Payer: Self-pay | Admitting: Nephrology

## 2014-02-20 DIAGNOSIS — Y849 Medical procedure, unspecified as the cause of abnormal reaction of the patient, or of later complication, without mention of misadventure at the time of the procedure: Secondary | ICD-10-CM | POA: Insufficient documentation

## 2014-02-20 DIAGNOSIS — N186 End stage renal disease: Secondary | ICD-10-CM | POA: Insufficient documentation

## 2014-02-20 DIAGNOSIS — Z992 Dependence on renal dialysis: Principal | ICD-10-CM

## 2014-02-20 DIAGNOSIS — I4891 Unspecified atrial fibrillation: Secondary | ICD-10-CM | POA: Diagnosis not present

## 2014-02-20 DIAGNOSIS — Z86718 Personal history of other venous thrombosis and embolism: Secondary | ICD-10-CM | POA: Insufficient documentation

## 2014-02-20 DIAGNOSIS — N2581 Secondary hyperparathyroidism of renal origin: Secondary | ICD-10-CM | POA: Diagnosis not present

## 2014-02-20 DIAGNOSIS — Z87891 Personal history of nicotine dependence: Secondary | ICD-10-CM | POA: Diagnosis not present

## 2014-02-20 DIAGNOSIS — E119 Type 2 diabetes mellitus without complications: Secondary | ICD-10-CM | POA: Diagnosis not present

## 2014-02-20 DIAGNOSIS — Z7901 Long term (current) use of anticoagulants: Secondary | ICD-10-CM | POA: Insufficient documentation

## 2014-02-20 DIAGNOSIS — I120 Hypertensive chronic kidney disease with stage 5 chronic kidney disease or end stage renal disease: Secondary | ICD-10-CM | POA: Diagnosis not present

## 2014-02-20 DIAGNOSIS — I428 Other cardiomyopathies: Secondary | ICD-10-CM | POA: Diagnosis not present

## 2014-02-20 DIAGNOSIS — Z86711 Personal history of pulmonary embolism: Secondary | ICD-10-CM | POA: Diagnosis not present

## 2014-02-20 DIAGNOSIS — Z79899 Other long term (current) drug therapy: Secondary | ICD-10-CM | POA: Insufficient documentation

## 2014-02-20 DIAGNOSIS — Z794 Long term (current) use of insulin: Secondary | ICD-10-CM | POA: Diagnosis not present

## 2014-02-20 DIAGNOSIS — T82898A Other specified complication of vascular prosthetic devices, implants and grafts, initial encounter: Secondary | ICD-10-CM | POA: Insufficient documentation

## 2014-02-20 DIAGNOSIS — D638 Anemia in other chronic diseases classified elsewhere: Secondary | ICD-10-CM | POA: Diagnosis not present

## 2014-02-20 MED ORDER — LIDOCAINE HCL 1 % IJ SOLN
INTRAMUSCULAR | Status: AC
  Filled 2014-02-20: qty 20

## 2014-02-20 MED ORDER — IOHEXOL 300 MG/ML  SOLN
100.0000 mL | Freq: Once | INTRAMUSCULAR | Status: AC | PRN
  Administered 2014-02-20: 50 mL via INTRAVENOUS

## 2014-02-20 NOTE — Procedures (Signed)
Shuntogram demonstrates recurrent stenosis at venous anastomosis and upper arm outflow vein.  The venous stenoses were successfully treated with 7 mm angioplasty balloon.  Stenosis resolved with angioplasty.  No immediate complication.

## 2014-02-20 NOTE — H&P (Signed)
Chief Complaint: decreased flows from right arm shunt   Referring Physician(s): Deterding,James L  History of Present Illness: Kelly Gallegos is a 63 y.o. female with history of ESRD and recurrent stenosis of previously angioplastied right upper arm outflow vein presents today for shuntogram and possible repeat angioplasty.  Past Medical History  Diagnosis Date  . Diabetes mellitus     Type II  . Hypertension   . Atrial fibrillation   . Anemia     Anemia of chronic disease  . Thyroid disease     Secondary Hyperparathyroidism  . Necrotizing fasciitis 2012  . CHF (congestive heart failure)   . Shortness of breath   . Chronic kidney disease     T TH SAT [facility]  . Pneumonia 2010  . DVT (deep venous thrombosis) approx 1999  . Pulmonary embolus approx 1999  . Cardiomyopathy     presumed non-ischemic due to no ischemia on stress 07/2010 [facility])    Past Surgical History  Procedure Laterality Date  . Av fistula placement  02/10/2011    Left Brachiocephalic Arteriovenous Fistula Formation  . Back surgery  10-2010    pt. 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 Avf revision  07-23-2011    left  . Salivary gland surgery      stone removed  . Cesarean section    . Av fistula placement  12/10/2011    Procedure: INSERTION OF ARTERIOVENOUS (AV) GORE-TEX GRAFT ARM;  Surgeon: Angelia Mould, MD;  Location: [facility];  Service: Vascular;  Laterality: Right;    Allergies: Sulfa antibiotics  Medications: Prior to Admission medications   Medication Sig Start Date End Date Taking? Authorizing Provider  calcium carbonate (TUMS - DOSED IN MG ELEMENTAL CALCIUM) 500 MG chewable tablet Chew 1 tablet by mouth 3 (three) times daily before meals. Also before snacks    Historical Provider, MD  digoxin (LANOXIN) 0.125 MG tablet Take 125 mcg by mouth 3 (three) times a week. Monday, Wednesday, Friday    Historical Provider, MD  folic acid-vitamin  b complex-vitamin c-selenium-zinc (DIALYVITE) 3 MG TABS Take 1 tablet by mouth daily.    Historical Provider, MD  glipiZIDE (GLUCOTROL) 5 MG tablet Take 5 mg by mouth 2 (two) times daily before a meal.    Historical Provider, MD  metoprolol tartrate (LOPRESSOR) 25 MG tablet Take 25 mg by mouth 2 (two) times daily.    Historical Provider, MD  Multiple Vitamin (MULITIVITAMIN WITH MINERALS) TABS Take 1 tablet by mouth daily.    Historical Provider, MD  warfarin (COUMADIN) 5 MG tablet Take 5 mg by mouth. One on Tue, Wed, Fri, Sat, Sun P.M.    Historical Provider, MD  warfarin (COUMADIN) 7.5 MG tablet Take 7.5 mg by mouth daily. One on Monday and Thursday P.M.    Historical Provider, MD    Family History  Problem Relation Age of Onset  . Anesthesia problems Neg Hx   . Hypotension Neg Hx   . Malignant hyperthermia Neg Hx   . Pseudochol deficiency Neg Hx   . Cancer Mother   . Heart disease Father   . Cancer Sister     History   Social History  . Marital Status: Legally Separated    Spouse Name: N/A    Number of Children: N/A  . Years of Education: N/A   Social History Main Topics  . Smoking status: Former Smoker -- 20 years  Types: Cigarettes    Quit date: 05/31/2010  . Smokeless tobacco: Never Used  . Alcohol Use: No  . Drug Use: No  . Sexual Activity: Not Currently    Birth Control/ Protection: Post-menopausal   Other Topics Concern  . Not on file   Social History Narrative  . No narrative on file         Review of Systems  Constitutional: Negative for fever and chills.  Respiratory: Negative for cough and shortness of breath.   Cardiovascular: Negative for chest pain.  Gastrointestinal: Negative for nausea, vomiting and blood in stool.  Genitourinary: Negative for hematuria.  Musculoskeletal: Positive for back pain.  Neurological: Negative for headaches.    Vital Signs: There were no vitals taken for this visit.  Physical Exam  Constitutional: She is  oriented to person, place, and time. She appears well-developed and well-nourished.  Cardiovascular:  irre irreg; RUE AVG with positive thrill/bruit  Pulmonary/Chest: Effort normal.  Sl dim BS rt base, left clear  Abdominal: Soft. Bowel sounds are normal. There is no tenderness.  Musculoskeletal: Normal range of motion. She exhibits no edema.  Neurological: She is alert and oriented to person, place, and time.    Imaging: No results found.  Labs: Lab Results  Component Value Date   WBC 11.8* 09/08/2011   HCT 39.0 12/10/2011   MCV 92.2 09/08/2011   PLT 256 09/08/2011   NA 137 12/10/2011   K 4.1 12/10/2011   CL 94* 09/08/2011   CO2 27 09/08/2011   GLUCOSE 211* 12/10/2011   BUN 31* 09/08/2011   CREATININE 2.68* 09/08/2011   CALCIUM 9.1 09/08/2011   ALBUMIN 3.2* 09/07/2011   GFRNONAA 18* 09/08/2011   GFRAA 21* 09/08/2011   INR 1.18 12/10/2011    Assessment and Plan:  Kelly Gallegos is a 63 y.o. female with history of ESRD and recurrent stenosis of previously angioplastied right upper arm outflow vein presents today for shuntogram and possible repeat angioplasty. Details/risks of procedure d/w pt with her understanding and consent.         Signed: Autumn Messing 02/20/2014, 12:03 PM

## 2014-05-09 ENCOUNTER — Encounter (HOSPITAL_COMMUNITY): Payer: Self-pay | Admitting: Vascular Surgery

## 2014-05-28 ENCOUNTER — Other Ambulatory Visit (HOSPITAL_COMMUNITY): Payer: Self-pay | Admitting: Nephrology

## 2014-05-28 DIAGNOSIS — Z992 Dependence on renal dialysis: Principal | ICD-10-CM

## 2014-05-28 DIAGNOSIS — N186 End stage renal disease: Secondary | ICD-10-CM

## 2014-06-05 ENCOUNTER — Other Ambulatory Visit (HOSPITAL_COMMUNITY): Payer: Self-pay | Admitting: Nephrology

## 2014-06-05 ENCOUNTER — Encounter (HOSPITAL_COMMUNITY): Payer: Self-pay

## 2014-06-05 ENCOUNTER — Ambulatory Visit (HOSPITAL_COMMUNITY)
Admission: RE | Admit: 2014-06-05 | Discharge: 2014-06-05 | Disposition: A | Discharge status: Home / Self Care | Payer: Medicare (Managed Care) | Source: Ambulatory Visit | Attending: Nephrology | Admitting: Nephrology

## 2014-06-05 DIAGNOSIS — T82858A Stenosis of vascular prosthetic devices, implants and grafts, initial encounter: Secondary | ICD-10-CM | POA: Diagnosis not present

## 2014-06-05 DIAGNOSIS — Z992 Dependence on renal dialysis: Secondary | ICD-10-CM

## 2014-06-05 DIAGNOSIS — Z86711 Personal history of pulmonary embolism: Secondary | ICD-10-CM | POA: Insufficient documentation

## 2014-06-05 DIAGNOSIS — I509 Heart failure, unspecified: Secondary | ICD-10-CM | POA: Diagnosis not present

## 2014-06-05 DIAGNOSIS — I4891 Unspecified atrial fibrillation: Secondary | ICD-10-CM | POA: Diagnosis not present

## 2014-06-05 DIAGNOSIS — Z87891 Personal history of nicotine dependence: Secondary | ICD-10-CM | POA: Insufficient documentation

## 2014-06-05 DIAGNOSIS — Y832 Surgical operation with anastomosis, bypass or graft as the cause of abnormal reaction of the patient, or of later complication, without mention of misadventure at the time of the procedure: Secondary | ICD-10-CM | POA: Insufficient documentation

## 2014-06-05 DIAGNOSIS — N189 Chronic kidney disease, unspecified: Secondary | ICD-10-CM | POA: Insufficient documentation

## 2014-06-05 DIAGNOSIS — Z79899 Other long term (current) drug therapy: Secondary | ICD-10-CM | POA: Insufficient documentation

## 2014-06-05 DIAGNOSIS — I129 Hypertensive chronic kidney disease with stage 1 through stage 4 chronic kidney disease, or unspecified chronic kidney disease: Secondary | ICD-10-CM | POA: Diagnosis not present

## 2014-06-05 DIAGNOSIS — E119 Type 2 diabetes mellitus without complications: Secondary | ICD-10-CM | POA: Diagnosis not present

## 2014-06-05 DIAGNOSIS — I429 Cardiomyopathy, unspecified: Secondary | ICD-10-CM | POA: Diagnosis not present

## 2014-06-05 DIAGNOSIS — D638 Anemia in other chronic diseases classified elsewhere: Secondary | ICD-10-CM | POA: Diagnosis not present

## 2014-06-05 DIAGNOSIS — N2581 Secondary hyperparathyroidism of renal origin: Secondary | ICD-10-CM | POA: Insufficient documentation

## 2014-06-05 DIAGNOSIS — N186 End stage renal disease: Secondary | ICD-10-CM

## 2014-06-05 DIAGNOSIS — Z7901 Long term (current) use of anticoagulants: Secondary | ICD-10-CM | POA: Insufficient documentation

## 2014-06-05 MED ORDER — IOHEXOL 300 MG/ML  SOLN
100.0000 mL | Freq: Once | INTRAMUSCULAR | Status: AC | PRN
  Administered 2014-06-05: 50 mL via INTRAVENOUS

## 2014-06-05 MED ORDER — LIDOCAINE HCL 1 % IJ SOLN
INTRAMUSCULAR | Status: AC
  Filled 2014-06-05: qty 20

## 2014-06-05 NOTE — Procedures (Signed)
Right upper extremity shuntogram demonstrated recurrent venous stenosis.  PTA performed in right upper arm and just above elbow with 7 mm x 40 mm Conquest balloon.  Stenosis resolved after PTA.  No immediate complication.

## 2014-06-05 NOTE — H&P (Signed)
Chief Complaint: Malfunctioning R upper extremity dialysis graft  Referring Physician(s): Deterding,James L  History of Present Illness: Kelly Gallegos is a 64 y.o. female  R arm dialysis graft noted slow flow last dialysis session Shuntogram in IR today reveals narrowed areas in the outflow vein Scheduled now for angioplasty/stent placement Last intervention 02/20/14: PTA in IR with Dr Anselm Pancoast; successful    Past Medical History  Diagnosis Date  . Diabetes mellitus     Type II  . Hypertension   . Atrial fibrillation   . Anemia     Anemia of chronic disease  . Thyroid disease     Secondary Hyperparathyroidism  . Necrotizing fasciitis 2012  . CHF (congestive heart failure)   . Shortness of breath   . Chronic kidney disease     T TH SAT [facility]  . Pneumonia 2010  . DVT (deep venous thrombosis) approx 1999  . Pulmonary embolus approx 1999  . Cardiomyopathy     presumed non-ischemic due to no ischemia on stress 07/2010 [facility])    Past Surgical History  Procedure Laterality Date  . Av fistula placement  02/10/2011    Left Brachiocephalic Arteriovenous Fistula Formation  . Back surgery  10-2010    pt. 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 Avf revision  07-23-2011    left  . Salivary gland surgery      stone removed  . Cesarean section    . Av fistula placement  12/10/2011    Procedure: INSERTION OF ARTERIOVENOUS (AV) GORE-TEX GRAFT ARM;  Surgeon: Angelia Mould, MD;  Location: [facility];  Service: Vascular;  Laterality: Right;  . Fistulogram Left 07/05/2011    Procedure: FISTULOGRAM;  Surgeon: Angelia Mould, MD;  Location: [facility] CATH LAB;  Service: Cardiovascular;  Laterality: Left;    Allergies: Sulfa antibiotics  Medications: Prior to Admission medications   Medication Sig Start Date End Date Taking? Authorizing Provider  calcium carbonate (TUMS - DOSED IN MG ELEMENTAL CALCIUM) 500 MG chewable tablet Chew 1  tablet by mouth 3 (three) times daily before meals. Also before snacks    Historical Provider, MD  digoxin (LANOXIN) 0.125 MG tablet Take 125 mcg by mouth 3 (three) times a week. Monday, Wednesday, Friday    Historical Provider, MD  folic acid-vitamin b complex-vitamin c-selenium-zinc (DIALYVITE) 3 MG TABS Take 1 tablet by mouth daily.    Historical Provider, MD  glipiZIDE (GLUCOTROL) 5 MG tablet Take 5 mg by mouth 2 (two) times daily before a meal.    Historical Provider, MD  metoprolol tartrate (LOPRESSOR) 25 MG tablet Take 25 mg by mouth 2 (two) times daily.    Historical Provider, MD  Multiple Vitamin (MULITIVITAMIN WITH MINERALS) TABS Take 1 tablet by mouth daily.    Historical Provider, MD  warfarin (COUMADIN) 5 MG tablet Take 5 mg by mouth. One on Tue, Wed, Fri, Sat, Sun P.M.    Historical Provider, MD  warfarin (COUMADIN) 7.5 MG tablet Take 7.5 mg by mouth daily. One on Monday and Thursday P.M.    Historical Provider, MD    Family History  Problem Relation Age of Onset  . Anesthesia problems Neg Hx   . Hypotension Neg Hx   . Malignant hyperthermia Neg Hx   . Pseudochol deficiency Neg Hx   . Cancer Mother   . Heart disease Father   . Cancer Sister     History   Social History  .  Marital Status: Legally Separated    Spouse Name: N/A    Number of Children: N/A  . Years of Education: N/A   Social History Main Topics  . Smoking status: Former Smoker -- 20 years    Types: Cigarettes    Quit date: 05/31/2010  . Smokeless tobacco: Never Used  . Alcohol Use: No  . Drug Use: No  . Sexual Activity: Not Currently    Birth Control/ Protection: Post-menopausal   Other Topics Concern  . None   Social History Narrative    Review of Systems: A 12 point ROS discussed and pertinent positives are indicated in the HPI above.  All other systems are negative.  Review of Systems  Vital Signs: There were no vitals taken for this visit.  Physical Exam  Constitutional: She is  oriented to person, place, and time.  Cardiovascular: Regular rhythm.   No murmur heard. Irreg rate  Pulmonary/Chest: Effort normal and breath sounds normal. She has no wheezes.  Musculoskeletal: Normal range of motion.  Neurological: She is alert and oriented to person, place, and time.  Psychiatric: She has a normal mood and affect. Her behavior is normal. Judgment and thought content normal.    Imaging: No results found.  Labs:  CBC: No results for input(s): WBC, HGB, HCT, PLT in the last 8760 hours.  COAGS: No results for input(s): INR, APTT in the last 8760 hours.  BMP: No results for input(s): NA, K, CL, CO2, GLUCOSE, BUN, CALCIUM, CREATININE, GFRNONAA, GFRAA in the last 8760 hours.  Invalid input(s): CMP  LIVER FUNCTION TESTS: No results for input(s): BILITOT, AST, ALT, ALKPHOS, PROT, ALBUMIN in the last 8760 hours.  TUMOR MARKERS: No results for input(s): AFPTM, CEA, CA199, CHROMGRNA in the last 8760 hours.  Assessment and Plan:  Rt arm dialysis graft slow flow shuntogram in IR today reveals narrowed areas Now scheduled for angioplasty possible stent placement Pt aware of procedure benefits and risks and agreeable to proceed Consent signed and in chart  Thank you for this interesting consult.  I greatly enjoyed meeting Kelly Gallegos and look forward to participating in their care    I spent a total of 20 minutes face to face in clinical consultation, greater than 50% of which was counseling/coordinating care for Rt arm dialysis graft pta/stent  Signed: TURPIN,PAMELA A 06/05/2014, 8:50 AM

## 2014-09-09 ENCOUNTER — Other Ambulatory Visit (HOSPITAL_COMMUNITY): Payer: Self-pay | Admitting: Nephrology

## 2014-09-09 DIAGNOSIS — Z992 Dependence on renal dialysis: Principal | ICD-10-CM

## 2014-09-09 DIAGNOSIS — N186 End stage renal disease: Secondary | ICD-10-CM

## 2014-09-11 ENCOUNTER — Inpatient Hospital Stay (HOSPITAL_COMMUNITY): Admission: RE | Admit: 2014-09-11 | Payer: Medicare (Managed Care) | Source: Ambulatory Visit

## 2014-09-13 ENCOUNTER — Other Ambulatory Visit (HOSPITAL_COMMUNITY): Payer: Self-pay | Admitting: Nephrology

## 2014-09-13 ENCOUNTER — Ambulatory Visit (HOSPITAL_COMMUNITY)
Admission: RE | Admit: 2014-09-13 | Discharge: 2014-09-13 | Disposition: A | Discharge status: Home / Self Care | Payer: Medicare (Managed Care) | Source: Ambulatory Visit | Attending: Interventional Radiology | Admitting: Interventional Radiology

## 2014-09-13 DIAGNOSIS — N186 End stage renal disease: Secondary | ICD-10-CM | POA: Diagnosis not present

## 2014-09-13 DIAGNOSIS — Z992 Dependence on renal dialysis: Principal | ICD-10-CM

## 2014-09-13 DIAGNOSIS — I871 Compression of vein: Secondary | ICD-10-CM | POA: Insufficient documentation

## 2014-09-13 DIAGNOSIS — T82590A Other mechanical complication of surgically created arteriovenous fistula, initial encounter: Secondary | ICD-10-CM | POA: Diagnosis present

## 2014-09-13 DIAGNOSIS — Y832 Surgical operation with anastomosis, bypass or graft as the cause of abnormal reaction of the patient, or of later complication, without mention of misadventure at the time of the procedure: Secondary | ICD-10-CM | POA: Insufficient documentation

## 2014-09-13 DIAGNOSIS — T82858A Stenosis of vascular prosthetic devices, implants and grafts, initial encounter: Secondary | ICD-10-CM | POA: Insufficient documentation

## 2014-09-13 MED ORDER — IOHEXOL 300 MG/ML  SOLN
100.0000 mL | Freq: Once | INTRAMUSCULAR | Status: AC | PRN
  Administered 2014-09-13: 1 mL via INTRAVENOUS

## 2014-09-13 MED ORDER — LIDOCAINE HCL 1 % IJ SOLN
INTRAMUSCULAR | Status: AC
  Filled 2014-09-13: qty 20

## 2014-09-13 NOTE — Procedures (Signed)
Interventional Radiology Procedure Note  Procedure: Right upper extremity AV graft shuntogram.  Angioplasty of 2 venous outflow stenoses.  Antecubital region, 36mm.  Prox upper arm, 16mm and 105mm.  Findings:  Improved flow through the graft after angioplasty.  Good thrill afterwards.  AV anastamosis is widely patent. Complications: No immediate Recommendations:  - Ok for HD - may remove z-stitch at dialysis - Routine care   Signed,  Dulcy Fanny. Earleen Newport, DO

## 2014-10-09 ENCOUNTER — Other Ambulatory Visit: Payer: Self-pay | Admitting: *Deleted

## 2014-10-09 DIAGNOSIS — I739 Peripheral vascular disease, unspecified: Secondary | ICD-10-CM

## 2014-10-21 ENCOUNTER — Encounter: Payer: Self-pay | Admitting: Vascular Surgery

## 2014-10-22 ENCOUNTER — Encounter: Payer: Self-pay | Admitting: Vascular Surgery

## 2014-10-23 ENCOUNTER — Encounter: Payer: Self-pay | Admitting: Vascular Surgery

## 2014-10-23 ENCOUNTER — Ambulatory Visit (HOSPITAL_COMMUNITY)
Admission: RE | Admit: 2014-10-23 | Discharge: 2014-10-23 | Disposition: A | Discharge status: Home / Self Care | Payer: Medicare (Managed Care) | Source: Ambulatory Visit | Attending: Vascular Surgery | Admitting: Vascular Surgery

## 2014-10-23 ENCOUNTER — Ambulatory Visit (INDEPENDENT_AMBULATORY_CARE_PROVIDER_SITE_OTHER): Payer: Medicare (Managed Care) | Admitting: Vascular Surgery

## 2014-10-23 VITALS — BP 117/65 | HR 66 | Resp 16 | Ht 63.0 in | Wt 189.0 lb

## 2014-10-23 DIAGNOSIS — I70219 Atherosclerosis of native arteries of extremities with intermittent claudication, unspecified extremity: Secondary | ICD-10-CM | POA: Diagnosis not present

## 2014-10-23 DIAGNOSIS — I739 Peripheral vascular disease, unspecified: Secondary | ICD-10-CM | POA: Insufficient documentation

## 2014-10-23 NOTE — Progress Notes (Signed)
Referred by:  Janifer Adie, MD [phone], [place] [address]  Reason for referral: bilateral leg ischemia  History of Present Illness  Kelly Gallegos is a 64 y.o. (1950/09/15) female who presents with chief complaint: color change in toes.  Patient was referred for concerns with ischemia in her feet due to color changes in feet.  This patient primary sx is back pain associated with episode of necrotizing fasciitis.  She required multiple episodes of debridement, which left her with chronic pain in her back.  This pain limits her ambulation.  Pain is described as sharp, severity 3-6/10, and associated with movement and rest.  The patient notes rarely claudication sx but as she does not often ambulate much, this rarely occurs.  The patient has no rest pain symptoms also and no leg wounds/ulcers.  Atherosclerotic risk factors include: DM, prior smoker.  The patient has decreased sensation in both feet due to diabetic peripheral neuropathy.  Past Medical History  Diagnosis Date  . Diabetes mellitus     Type II  . Hypertension   . Atrial fibrillation   . Anemia     Anemia of chronic disease  . Thyroid disease     Secondary Hyperparathyroidism  . Necrotizing fasciitis 2012  . CHF (congestive heart failure)   . Shortness of breath   . Chronic kidney disease     T TH SAT [facility]  . Pneumonia 2010  . DVT (deep venous thrombosis) approx 1999  . Pulmonary embolus approx 1999  . Cardiomyopathy     presumed non-ischemic due to no ischemia on stress 07/2010 [facility])    Past Surgical History  Procedure Laterality Date  . Av fistula placement  02/10/2011    Left Brachiocephalic Arteriovenous Fistula Formation  . Back surgery  10-2010    pt. 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 Avf revision  07-23-2011    left  . Salivary gland surgery      stone removed  . Cesarean section    . Av fistula placement  12/10/2011    Procedure:  INSERTION OF ARTERIOVENOUS (AV) GORE-TEX GRAFT ARM;  Surgeon: Angelia Mould, MD;  Location: [place];  Service: Vascular;  Laterality: Right;  . Fistulogram Left 07/05/2011    Procedure: FISTULOGRAM;  Surgeon: Angelia Mould, MD;  Location: [facility] CATH LAB;  Service: Cardiovascular;  Laterality: Left;    History   Social History  . Marital Status: Legally Separated    Spouse Name: N/A  . Number of Children: N/A  . Years of Education: N/A   Occupational History  . Not on file.   Social History Main Topics  . Smoking status: Former Smoker -- 20 years    Types: Cigarettes    Quit date: 05/31/2010  . Smokeless tobacco: Never Used  . Alcohol Use: No  . Drug Use: No  . Sexual Activity: Not Currently    Birth Control/ Protection: Post-menopausal   Other Topics Concern  . Not on file   Social History Narrative    Family History  Problem Relation Age of Onset  . Anesthesia problems Neg Hx   . Hypotension Neg Hx   . Malignant hyperthermia Neg Hx   . Pseudochol deficiency Neg Hx   . Cancer Mother   . Heart disease Father   . Cancer Sister     Current Outpatient Prescriptions on File Prior to Visit  Medication Sig Dispense Refill  .  calcium carbonate (TUMS - DOSED IN MG ELEMENTAL CALCIUM) 500 MG chewable tablet Chew 1 tablet by mouth 3 (three) times daily before meals. Also before snacks    . digoxin (LANOXIN) 0.125 MG tablet Take 125 mcg by mouth 3 (three) times a week. Monday, Wednesday, Friday    . folic acid-vitamin b complex-vitamin c-selenium-zinc (DIALYVITE) 3 MG TABS Take 1 tablet by mouth daily.    Marland Kitchen glipiZIDE (GLUCOTROL) 5 MG tablet Take 5 mg by mouth 2 (two) times daily before a meal.    . metoprolol tartrate (LOPRESSOR) 25 MG tablet Take 25 mg by mouth 2 (two) times daily.    Marland Kitchen warfarin (COUMADIN) 5 MG tablet Take 5 mg by mouth. One on Tue, Wed, Fri, Sat, Sun P.M.    . warfarin (COUMADIN) 7.5 MG tablet Take 7.5 mg by mouth daily. One on Monday and Thursday  P.M.    . Multiple Vitamin (MULITIVITAMIN WITH MINERALS) TABS Take 1 tablet by mouth daily.     No current facility-administered medications on file prior to visit.    Allergies  Allergen Reactions  . Sulfa Antibiotics Other (See Comments)    Unknown-childhood allergy    REVIEW OF SYSTEMS:  (Positives checked otherwise negative)  CARDIOVASCULAR:  []  chest pain, []  chest pressure, []  palpitations, []  shortness of breath when laying flat, []  shortness of breath with exertion,  []  pain in feet when walking, []  pain in feet when laying flat, []  history of blood clot in veins (DVT), []  history of phlebitis, []  swelling in legs, []  varicose veins  PULMONARY:  []  productive cough, []  asthma, []  wheezing  NEUROLOGIC:  []  weakness in arms or legs, [x]  numbness in arms or legs, []  difficulty speaking or slurred speech, []  temporary loss of vision in one eye, []  dizziness  HEMATOLOGIC:  []  bleeding problems, []  problems with blood clotting too easily  MUSCULOSKEL:  []  joint pain, []  joint swelling, [x]  chronic back pain  GASTROINTEST:  []  vomiting blood, []  blood in stool     GENITOURINARY:  []  burning with urination, []  blood in urine  PSYCHIATRIC:  []  history of major depression  INTEGUMENTARY:  []  rashes, []  ulcers  CONSTITUTIONAL:  []  fever, []  chills  For VQI Use Only  PRE-ADM LIVING: Home  AMB STATUS: Ambulatory  CAD Sx: None  PRIOR CHF: Mild  STRESS TEST: [x]  No, [ ]  Normal, [ ]  + ischemia, [ ]  + MI, [ ]  Both   Physical Examination  Filed Vitals:   10/23/14 1418  BP: 117/65  Pulse: 66  Resp: 16  Height: 5\' 3"  (1.6 m)  Weight: 189 lb (85.73 kg)   Body mass index is 33.49 kg/(m^2).  General: A&O x 3, WDWN  Head: [place]/AT  Ear/Nose/Throat: Hearing grossly intact, nares w/o erythema or drainage, oropharynx w/o Erythema/Exudate, Mallampati score: 3  Eyes: PERRLA, EOMI  Neck: Supple, no nuchal rigidity, no palpable LAD  Pulmonary: Sym exp, good air movt, CTAB,  no rales, rhonchi, & wheezing  Cardiac: irr, irr rhythm and rate, Nl S1, S2, no Murmurs, rubs or gallops  Vascular: Vessel Right Left  Radial Palpable Palpable  Ulnar Not Palpable Not Palpable  Brachial Palpable Palpable  Carotid Palpable, without bruit Palpable, without bruit  Aorta Not palpable N/A  Femoral Palpable Palpable  Popliteal Not palpable Not palpable  PT Not Palpable Not Palpable  DP Not Palpable Not Palpable   Gastrointestinal: soft, NTND, -G/R, - HSM, - masses, - CVAT B  Musculoskeletal: M/S 5/5 throughout ,  Extremities without ischemic changes , B LDS, no gangrene or ulcers in either feet  Neurologic: CN 2-12 intact , Pain and light touch intact in extremities except decreased in both feet, Motor exam as listed above  Psychiatric: Judgment intact, Mood & affect appropriate for pt's clinical situation  Dermatologic: See M/S exam for extremity exam, no rashes otherwise noted  Lymph : No Cervical, Axillary, or Inguinal lymphadenopathy    Non-Invasive Vascular Imaging  ABI (Date: 10/23/2014)  R: 0.39, DP: damp mono, PT: damp mono, TBI: 0.39  L: 1.09, DP: mono, PT: mono, TBI: 0.54  Outside Studies/Documentation 4 pages of outside documents were reviewed including: outpatient nephrology chart.  Medical Decision Making  Kelly Gallegos is a 64 y.o. female who presents with: RLE severe PAD, LLE moderate PAD, no evidence of CLI yet, chronic back pain due necrotizing fasciitis   While the patient has an ABI and waveform consistent with RLE severe PAD, she is asx from her feet.  At this point, the risk-benefit analysis does not favor angiography.  Her back issues limit her ambulation, so she doesn't get claudication.  Additionally, she does not have wounds on her feet at this point, so her severe PAD is not causing any healing issues.  We discussed the sx and findings of CLI, she will follow up ASAP if she develops any of these sx.  I discussed in depth with  the patient the nature of atherosclerosis, and emphasized the importance of maximal medical management including strict control of blood pressure, blood glucose, and lipid levels, antiplatelet agents, obtaining regular exercise, and cessation of smoking.  The patient is aware that without maximal medical management the underlying atherosclerotic disease process will progress, limiting the benefit of any interventions. The patient is currently not on a statin.  Will need to track down her lipid panel to see if she needs to be started on such. The patient is currently not  on an anti-platelet: due to risk of bleeding with Warfarin, which is taken for afib. At this point, I would simply repeat the ABI in 3 months to keep an eye on her limb perfusion.  If it worsens, the risk of angiography could be justified.  Thank you for allowing [place] to participate in this patient's care.  Adele Barthel, MD [facility] Office: [phone] Pager: [phone]  10/23/2014, 2:50 PM

## 2015-01-03 ENCOUNTER — Other Ambulatory Visit (HOSPITAL_COMMUNITY): Payer: Self-pay | Admitting: Nephrology

## 2015-01-03 DIAGNOSIS — N19 Unspecified kidney failure: Secondary | ICD-10-CM

## 2015-01-08 ENCOUNTER — Other Ambulatory Visit (HOSPITAL_COMMUNITY): Payer: Self-pay | Admitting: Nephrology

## 2015-01-08 ENCOUNTER — Ambulatory Visit (HOSPITAL_COMMUNITY)
Admission: RE | Admit: 2015-01-08 | Discharge: 2015-01-08 | Disposition: A | Discharge status: Home / Self Care | Payer: Medicare (Managed Care) | Source: Ambulatory Visit | Attending: Nephrology | Admitting: Nephrology

## 2015-01-08 DIAGNOSIS — I120 Hypertensive chronic kidney disease with stage 5 chronic kidney disease or end stage renal disease: Secondary | ICD-10-CM | POA: Diagnosis not present

## 2015-01-08 DIAGNOSIS — N19 Unspecified kidney failure: Secondary | ICD-10-CM

## 2015-01-08 DIAGNOSIS — Z992 Dependence on renal dialysis: Secondary | ICD-10-CM | POA: Insufficient documentation

## 2015-01-08 DIAGNOSIS — T82858A Stenosis of vascular prosthetic devices, implants and grafts, initial encounter: Secondary | ICD-10-CM | POA: Diagnosis not present

## 2015-01-08 DIAGNOSIS — N186 End stage renal disease: Secondary | ICD-10-CM | POA: Diagnosis not present

## 2015-01-08 MED ORDER — LIDOCAINE HCL 1 % IJ SOLN
INTRAMUSCULAR | Status: AC
  Filled 2015-01-08: qty 20

## 2015-01-08 MED ORDER — IOHEXOL 300 MG/ML  SOLN
100.0000 mL | Freq: Once | INTRAMUSCULAR | Status: DC | PRN
  Administered 2015-01-08: 50 mL via INTRAVENOUS
  Filled 2015-01-08: qty 100

## 2015-01-09 ENCOUNTER — Other Ambulatory Visit: Payer: Self-pay | Admitting: *Deleted

## 2015-01-09 DIAGNOSIS — I739 Peripheral vascular disease, unspecified: Secondary | ICD-10-CM

## 2015-01-23 ENCOUNTER — Encounter: Payer: Self-pay | Admitting: Vascular Surgery

## 2015-01-24 ENCOUNTER — Ambulatory Visit (INDEPENDENT_AMBULATORY_CARE_PROVIDER_SITE_OTHER): Payer: Medicare (Managed Care) | Admitting: Vascular Surgery

## 2015-01-24 ENCOUNTER — Encounter: Payer: Self-pay | Admitting: Vascular Surgery

## 2015-01-24 ENCOUNTER — Ambulatory Visit (HOSPITAL_COMMUNITY)
Admission: RE | Admit: 2015-01-24 | Discharge: 2015-01-24 | Disposition: A | Discharge status: Home / Self Care | Payer: Medicare (Managed Care) | Source: Ambulatory Visit | Attending: Vascular Surgery | Admitting: Vascular Surgery

## 2015-01-24 VITALS — BP 107/56 | HR 82 | Temp 98.3°F | Resp 14 | Ht 63.0 in | Wt 187.0 lb

## 2015-01-24 DIAGNOSIS — I70219 Atherosclerosis of native arteries of extremities with intermittent claudication, unspecified extremity: Secondary | ICD-10-CM

## 2015-01-24 DIAGNOSIS — I739 Peripheral vascular disease, unspecified: Secondary | ICD-10-CM | POA: Diagnosis not present

## 2015-01-24 NOTE — Addendum Note (Signed)
Addended by: Dorthula Rue L on: 01/24/2015 02:30 PM   Modules accepted: Orders

## 2015-01-24 NOTE — Progress Notes (Signed)
Established Intermittent Claudication  History of Present Illness  Kelly Gallegos is a 64 y.o. (Jul 27, 1950) female who presents with chief complaint: chronic back pain.  The patient's back pain is better than last visit.  These sx are related to her episode of necrotizing fasciitis.  Her intermittent claudication is limited as her back pain limits her ambulation.  She denies any foot wounds or rest pain at this point.  The patient's treatment regimen currently included: maximal medical management.  She is unable to comply with a walking plan.  The patient's PMH, PSH, and SH, and FamHx are unchanged from 10/23/14.  Current Outpatient Prescriptions  Medication Sig Dispense Refill  . acetaminophen (TYLENOL) 500 MG tablet Take 500 mg by mouth every 4 (four) hours as needed.    . calcium carbonate (TUMS - DOSED IN MG ELEMENTAL CALCIUM) 500 MG chewable tablet Chew 1 tablet by mouth 3 (three) times daily before meals. Also before snacks    . cetirizine (ZYRTEC) 5 MG tablet Take 5 mg by mouth daily.    . digoxin (LANOXIN) 0.125 MG tablet Take 125 mcg by mouth 3 (three) times a week. Monday, Wednesday, Friday    . folic acid-vitamin b complex-vitamin c-selenium-zinc (DIALYVITE) 3 MG TABS Take 1 tablet by mouth daily.    Marland Kitchen glipiZIDE (GLUCOTROL) 5 MG tablet Take 5 mg by mouth 2 (two) times daily before a meal.    . Hypromellose (NATURES TEARS OP) Apply 1 drop to eye 2 (two) times daily.    . Insulin Glargine (LANTUS) 100 UNIT/ML Solostar Pen Inject 40 Units into the skin daily at 10 pm.    . Lidocaine-Prilocaine, Bulk, 2.5-2.5 % CREA by Does not apply route 3 (three) times a week.    . metoprolol tartrate (LOPRESSOR) 25 MG tablet Take 25 mg by mouth 2 (two) times daily.    . miconazole (MICOTIN) 2 % powder Apply topically as needed for itching. 1 app applied topically bid prn to abdominal folds    . Multiple Vitamin (MULITIVITAMIN WITH MINERALS) TABS Take 1 tablet by mouth daily.    . simethicone  (MYLICON) 80 MG chewable tablet Chew 80 mg by mouth 4 (four) times daily.    Marland Kitchen warfarin (COUMADIN) 5 MG tablet Take 5 mg by mouth. One on Tue, Wed, Fri, Sat, Sun P.M.    . warfarin (COUMADIN) 7.5 MG tablet Take 7.5 mg by mouth daily. One on Monday and Thursday P.M.     No current facility-administered medications for this visit.    Allergies  Allergen Reactions  . Sulfa Antibiotics Other (See Comments)    Unknown-childhood allergy    On ROS today: no rest pain, chronic back pain improved.   Physical Examination  Filed Vitals:   01/24/15 1128  BP: 107/56  Pulse: 82  Temp: 98.3 F (36.8 C)  TempSrc: Oral  Resp: 14  Height: 5\' 3"  (1.6 m)  Weight: 187 lb (84.823 kg)  SpO2: 99%   Body mass index is 33.13 kg/(m^2).  General: A&O x 3, WD, Obese,   Pulmonary: Sym exp, good air movt, CTAB, no rales, rhonchi, & wheezing  Cardiac: RRR, Nl S1, S2, no Murmurs, rubs or gallops  Vascular: Vessel Right Left  Radial Faintly Palpable Palpable  Brachial Palpable Palpable  Carotid Palpable, without bruit Palpable, without bruit  Aorta Not palpable N/A  Femoral Palpable Palpable  Popliteal Not palpable Not palpable  PT Not Palpable Not Palpable  DP Not Palpable Not Palpable   Gastrointestinal:  soft, NTND, no G/R, no HSM, no masses, no CVAT B  Musculoskeletal: M/S 5/5 throughout , Extremities without ischemic changes , palpable thrill in R FA AVG, bilateral leg with bronze appearance, no rubor, no ulcers or gangrene  Neurologic: Pain and light touch intact in extremities , Motor exam as listed above   Non-Invasive Vascular Imaging ABI (Date: 01/24/2015)  R: [place], DP: mono, PT: mono, TBI: 0.40  L: 1.4, DP: mono, PT: mono, TBI: 0.50   Medical Decision Making  Kelly Gallegos is a 64 y.o. female who presents with:  Chronic back pain related to necrotizing fasciitis, RLE severe PAD, LLE mod-severe PAD, minimal intermittent claudication without evidence of critical limb  ischemia.   Pt remains asx due to limited ambulation due to her back.  Subsequently, it is hard to justify the risks of angiography at this point.  Based on the patient's vascular studies and examination, I have offered the patient: q6 month ABI.  I discussed in depth with the patient the nature of atherosclerosis, and emphasized the importance of maximal medical management including strict control of blood pressure, blood glucose, and lipid levels, antiplatelet agents, obtaining regular exercise, and cessation of smoking.    The patient is aware that without maximal medical management the underlying atherosclerotic disease process will progress, limiting the benefit of any interventions. The patient is currently not on a statin.  Pt wants to follow up with PCP in this regards. The patient is currently not on an anti-platelet: due to being on Warfarin. She is already a fall risk due to her back.  Thank you for allowing [place] to participate in this patient's care.   Adele Barthel, MD [facility] Office: [phone] Pager: [phone]  01/24/2015, 11:59 AM

## 2015-03-25 ENCOUNTER — Other Ambulatory Visit (HOSPITAL_COMMUNITY): Payer: Self-pay | Admitting: Nephrology

## 2015-03-25 DIAGNOSIS — N186 End stage renal disease: Secondary | ICD-10-CM

## 2015-03-31 ENCOUNTER — Other Ambulatory Visit (HOSPITAL_COMMUNITY): Payer: Self-pay | Admitting: Nephrology

## 2015-03-31 ENCOUNTER — Ambulatory Visit (HOSPITAL_COMMUNITY)
Admission: RE | Admit: 2015-03-31 | Discharge: 2015-03-31 | Disposition: A | Discharge status: Home / Self Care | Payer: Medicare (Managed Care) | Source: Ambulatory Visit | Attending: Nephrology | Admitting: Nephrology

## 2015-03-31 DIAGNOSIS — D631 Anemia in chronic kidney disease: Secondary | ICD-10-CM | POA: Diagnosis not present

## 2015-03-31 DIAGNOSIS — Z87891 Personal history of nicotine dependence: Secondary | ICD-10-CM | POA: Diagnosis not present

## 2015-03-31 DIAGNOSIS — E1122 Type 2 diabetes mellitus with diabetic chronic kidney disease: Secondary | ICD-10-CM | POA: Diagnosis not present

## 2015-03-31 DIAGNOSIS — Z8249 Family history of ischemic heart disease and other diseases of the circulatory system: Secondary | ICD-10-CM | POA: Insufficient documentation

## 2015-03-31 DIAGNOSIS — T82858A Stenosis of vascular prosthetic devices, implants and grafts, initial encounter: Secondary | ICD-10-CM | POA: Insufficient documentation

## 2015-03-31 DIAGNOSIS — Z86718 Personal history of other venous thrombosis and embolism: Secondary | ICD-10-CM | POA: Diagnosis not present

## 2015-03-31 DIAGNOSIS — N2581 Secondary hyperparathyroidism of renal origin: Secondary | ICD-10-CM | POA: Insufficient documentation

## 2015-03-31 DIAGNOSIS — Z7901 Long term (current) use of anticoagulants: Secondary | ICD-10-CM | POA: Insufficient documentation

## 2015-03-31 DIAGNOSIS — N186 End stage renal disease: Secondary | ICD-10-CM | POA: Diagnosis not present

## 2015-03-31 DIAGNOSIS — I4891 Unspecified atrial fibrillation: Secondary | ICD-10-CM | POA: Diagnosis not present

## 2015-03-31 DIAGNOSIS — Z794 Long term (current) use of insulin: Secondary | ICD-10-CM | POA: Diagnosis not present

## 2015-03-31 DIAGNOSIS — Z882 Allergy status to sulfonamides status: Secondary | ICD-10-CM | POA: Insufficient documentation

## 2015-03-31 DIAGNOSIS — Z86711 Personal history of pulmonary embolism: Secondary | ICD-10-CM | POA: Insufficient documentation

## 2015-03-31 DIAGNOSIS — I132 Hypertensive heart and chronic kidney disease with heart failure and with stage 5 chronic kidney disease, or end stage renal disease: Secondary | ICD-10-CM | POA: Insufficient documentation

## 2015-03-31 DIAGNOSIS — I429 Cardiomyopathy, unspecified: Secondary | ICD-10-CM | POA: Diagnosis not present

## 2015-03-31 DIAGNOSIS — I509 Heart failure, unspecified: Secondary | ICD-10-CM | POA: Insufficient documentation

## 2015-03-31 MED ORDER — IOHEXOL 300 MG/ML  SOLN
100.0000 mL | Freq: Once | INTRAMUSCULAR | Status: DC | PRN
  Administered 2015-03-31: 65 mL via INTRAVENOUS
  Filled 2015-03-31: qty 100

## 2015-03-31 MED ORDER — LIDOCAINE HCL 1 % IJ SOLN
INTRAMUSCULAR | Status: AC
  Filled 2015-03-31: qty 20

## 2015-03-31 NOTE — H&P (Signed)
Chief Complaint: Patient was seen in consultation today for abnormal AV graft flows at the request of [place]  Referring Physician(s): Deterding,James  History of Present Illness: Kelly Gallegos is a 64 y.o. 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  Patient's only complaint is chronic back pain.  Denies fevers, chills, breathing problems or new cardiac issues.  Patient is diabetic and checks sugar level every other day.  No problems with right forearm loop graft other than abnormal flow rates.    Past Medical History  Diagnosis Date  . Diabetes mellitus     Type II  . Hypertension   . Atrial fibrillation   . Anemia     Anemia of chronic disease  . Thyroid disease     Secondary Hyperparathyroidism  . Necrotizing fasciitis 2012  . CHF (congestive heart failure)   . Shortness of breath   . Chronic kidney disease     T TH SAT [address]  . Pneumonia 2010  . DVT (deep venous thrombosis) approx 1999  . Pulmonary embolus approx 1999  . Cardiomyopathy     presumed non-ischemic due to no ischemia on stress 07/2010 [facility])    Past Surgical History  Procedure Laterality Date  . Av fistula placement  02/10/2011    Left Brachiocephalic Arteriovenous Fistula Formation  . Back surgery  10-2010    pt. 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 Avf revision  07-23-2011    left  . Salivary gland surgery      stone removed  . Cesarean section    . Av fistula placement  12/10/2011    Procedure: INSERTION OF ARTERIOVENOUS (AV) GORE-TEX GRAFT ARM;  Surgeon: Angelia Mould, MD;  Location: [place];  Service: Vascular;  Laterality: Right;  . Fistulogram Left 07/05/2011    Procedure: FISTULOGRAM;  Surgeon: Angelia Mould, MD;  Location: [facility] CATH LAB;  Service: Cardiovascular;  Laterality: Left;    Allergies: Sulfa antibiotics  Medications: Prior to Admission  medications   Medication Sig Start Date End Date Taking? Authorizing Provider  acetaminophen (TYLENOL) 500 MG tablet Take 500 mg by mouth every 4 (four) hours as needed for moderate pain or headache.    Yes Historical Provider, MD  calcium carbonate (TUMS - DOSED IN MG ELEMENTAL CALCIUM) 500 MG chewable tablet Chew 1 tablet by mouth 3 (three) times daily before meals. Also before snacks   Yes Historical Provider, MD  cetirizine (ZYRTEC) 5 MG tablet Take 5 mg by mouth daily.   Yes Historical Provider, MD  digoxin (LANOXIN) 0.125 MG tablet Take 125 mcg by mouth 3 (three) times a week. Monday, Wednesday, Friday   Yes Historical Provider, MD  folic acid-vitamin b complex-vitamin c-selenium-zinc (DIALYVITE) 3 MG TABS Take 1 tablet by mouth daily.   Yes Historical Provider, MD  glipiZIDE (GLUCOTROL) 10 MG tablet Take 10 mg by mouth 2 (two) times daily before a meal.   Yes Historical Provider, MD  Hypromellose (NATURES TEARS OP) Apply 1 drop to eye 2 (two) times daily as needed (irritated eyes).    Yes Historical Provider, MD  Insulin Glargine (LANTUS) 100 UNIT/ML Solostar Pen Inject 40 Units into the skin daily at 10 pm.   Yes Historical Provider, MD  Lidocaine-Prilocaine, Bulk, 2.5-2.5 % CREA Apply 1 application topically 3 (three) times a week.    Yes Historical Provider, MD  metoprolol tartrate (LOPRESSOR) 25 MG tablet  Take 25 mg by mouth 2 (two) times daily.   Yes Historical Provider, MD  miconazole (MICOTIN) 2 % powder Apply topically as needed for itching. 1 app applied topically bid prn to abdominal folds   Yes Historical Provider, MD  simethicone (MYLICON) 80 MG chewable tablet Chew 80 mg by mouth 4 (four) times daily as needed for flatulence.    Yes Historical Provider, MD  warfarin (COUMADIN) 4 MG tablet Take 4-8 mg by mouth daily at 6 PM. Take 8 mg on Monday, Wed, and Friday. Take 4 mg on all other days.   Yes Historical Provider, MD     Family History  Problem Relation Age of Onset  .  Anesthesia problems Neg Hx   . Hypotension Neg Hx   . Malignant hyperthermia Neg Hx   . Pseudochol deficiency Neg Hx   . Cancer Mother   . Heart disease Father   . Cancer Sister     Social History   Social History  . Marital Status: Legally Separated    Spouse Name: N/A  . Number of Children: N/A  . Years of Education: N/A   Social History Main Topics  . Smoking status: Former Smoker -- 20 years    Types: Cigarettes    Quit date: 05/31/2010  . Smokeless tobacco: Never Used  . Alcohol Use: No  . Drug Use: No  . Sexual Activity: Not Currently    Birth Control/ Protection: Post-menopausal   Other Topics Concern  . Not on file   Social History Narrative     Review of Systems  Constitutional: Negative.   Respiratory: Negative.   Cardiovascular: Negative.   Genitourinary: Negative.   Neurological: Negative.     Vital Signs: There were no vitals taken for this visit.  Physical Exam  Constitutional: She appears well-developed and well-nourished.  Cardiovascular:  Irregular rate c/w atrial fibrillation. Could not palpate right radial or ulnar pulse.  Pulmonary/Chest: Effort normal and breath sounds normal.    Mallampati Score:  MD Evaluation Airway: WNL Heart: Other (comments) Heart  comments: irregular, a.fib. Abdomen: Other (comments) Chest/ Lungs: WNL ASA  Classification: 3 Mallampati/Airway Score: Two  Imaging: No results found.  Labs:  CBC: No results for input(s): WBC, HGB, HCT, PLT in the last 8760 hours.  COAGS: No results for input(s): INR, APTT in the last 8760 hours.  BMP: No results for input(s): NA, K, CL, CO2, GLUCOSE, BUN, CALCIUM, CREATININE, GFRNONAA, GFRAA in the last 8760 hours.  Invalid input(s): CMP  LIVER FUNCTION TESTS: No results for input(s): BILITOT, AST, ALT, ALKPHOS, PROT, ALBUMIN in the last 8760 hours.  TUMOR MARKERS: No results for input(s): AFPTM, CEA, CA199, CHROMGRNA in the last 8760 hours.  Assessment and  Plan:   64 yo with ESRD and abnormal flow rates in right forearm loop graft.  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  Plan for balloon angioplasty of venous lesion.  Procedure discussed with patient and informed consent obtained.     SignedCarylon Perches 03/31/2015, 8:32 AM

## 2015-03-31 NOTE — Procedures (Signed)
Successful balloon angioplasty of right upper extremity venous stenoses.  No immediate complication, minimal blood loss.  See full report in Radiology report.

## 2015-04-28 ENCOUNTER — Other Ambulatory Visit (HOSPITAL_COMMUNITY): Payer: Self-pay | Admitting: Nephrology

## 2015-04-28 DIAGNOSIS — Z992 Dependence on renal dialysis: Principal | ICD-10-CM

## 2015-04-28 DIAGNOSIS — N186 End stage renal disease: Secondary | ICD-10-CM

## 2015-04-30 ENCOUNTER — Other Ambulatory Visit (HOSPITAL_COMMUNITY): Payer: Self-pay | Admitting: Nephrology

## 2015-04-30 ENCOUNTER — Ambulatory Visit (HOSPITAL_COMMUNITY)
Admission: RE | Admit: 2015-04-30 | Discharge: 2015-04-30 | Disposition: A | Discharge status: Home / Self Care | Payer: Medicare (Managed Care) | Source: Ambulatory Visit | Attending: Nephrology | Admitting: Nephrology

## 2015-04-30 DIAGNOSIS — Z882 Allergy status to sulfonamides status: Secondary | ICD-10-CM | POA: Insufficient documentation

## 2015-04-30 DIAGNOSIS — I509 Heart failure, unspecified: Secondary | ICD-10-CM | POA: Diagnosis not present

## 2015-04-30 DIAGNOSIS — N186 End stage renal disease: Secondary | ICD-10-CM

## 2015-04-30 DIAGNOSIS — E1122 Type 2 diabetes mellitus with diabetic chronic kidney disease: Secondary | ICD-10-CM | POA: Insufficient documentation

## 2015-04-30 DIAGNOSIS — Z86718 Personal history of other venous thrombosis and embolism: Secondary | ICD-10-CM | POA: Insufficient documentation

## 2015-04-30 DIAGNOSIS — I4891 Unspecified atrial fibrillation: Secondary | ICD-10-CM | POA: Insufficient documentation

## 2015-04-30 DIAGNOSIS — N189 Chronic kidney disease, unspecified: Secondary | ICD-10-CM | POA: Insufficient documentation

## 2015-04-30 DIAGNOSIS — Y832 Surgical operation with anastomosis, bypass or graft as the cause of abnormal reaction of the patient, or of later complication, without mention of misadventure at the time of the procedure: Secondary | ICD-10-CM | POA: Diagnosis not present

## 2015-04-30 DIAGNOSIS — N2581 Secondary hyperparathyroidism of renal origin: Secondary | ICD-10-CM | POA: Insufficient documentation

## 2015-04-30 DIAGNOSIS — I130 Hypertensive heart and chronic kidney disease with heart failure and stage 1 through stage 4 chronic kidney disease, or unspecified chronic kidney disease: Secondary | ICD-10-CM | POA: Diagnosis not present

## 2015-04-30 DIAGNOSIS — Z992 Dependence on renal dialysis: Secondary | ICD-10-CM

## 2015-04-30 DIAGNOSIS — D631 Anemia in chronic kidney disease: Secondary | ICD-10-CM | POA: Diagnosis not present

## 2015-04-30 DIAGNOSIS — Z7901 Long term (current) use of anticoagulants: Secondary | ICD-10-CM | POA: Diagnosis not present

## 2015-04-30 DIAGNOSIS — Z86711 Personal history of pulmonary embolism: Secondary | ICD-10-CM | POA: Insufficient documentation

## 2015-04-30 DIAGNOSIS — T82858A Stenosis of vascular prosthetic devices, implants and grafts, initial encounter: Secondary | ICD-10-CM | POA: Diagnosis not present

## 2015-04-30 DIAGNOSIS — I429 Cardiomyopathy, unspecified: Secondary | ICD-10-CM | POA: Insufficient documentation

## 2015-04-30 DIAGNOSIS — Z87891 Personal history of nicotine dependence: Secondary | ICD-10-CM | POA: Insufficient documentation

## 2015-04-30 DIAGNOSIS — Z794 Long term (current) use of insulin: Secondary | ICD-10-CM | POA: Diagnosis not present

## 2015-04-30 MED ORDER — LIDOCAINE HCL 1 % IJ SOLN
INTRAMUSCULAR | Status: AC
  Filled 2015-04-30: qty 20

## 2015-04-30 MED ORDER — IOHEXOL 300 MG/ML  SOLN
100.0000 mL | Freq: Once | INTRAMUSCULAR | Status: AC | PRN
  Administered 2015-04-30: 50 mL via INTRAVENOUS

## 2015-04-30 NOTE — Consult Note (Signed)
Chief Complaint: Patient was seen in consultation today for No chief complaint on file.  at the request of Deterding,James  Referring Physician(s): Deterding,James  History of Present Illness: Kelly Gallegos is a 64 y.o. female elevated venous pressure. 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  Past Medical History  Diagnosis Date  . Diabetes mellitus     Type II  . Hypertension   . Atrial fibrillation   . Anemia     Anemia of chronic disease  . Thyroid disease     Secondary Hyperparathyroidism  . Necrotizing fasciitis 2012  . CHF (congestive heart failure)   . Shortness of breath   . Chronic kidney disease     T TH SAT [address]  . Pneumonia 2010  . DVT (deep venous thrombosis) approx 1999  . Pulmonary embolus approx 1999  . Cardiomyopathy     presumed non-ischemic due to no ischemia on stress 07/2010 [facility])    Past Surgical History  Procedure Laterality Date  . Av fistula placement  02/10/2011    Left Brachiocephalic Arteriovenous Fistula Formation  . Back surgery  10-2010    pt. 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 Avf revision  07-23-2011    left  . Salivary gland surgery      stone removed  . Cesarean section    . Av fistula placement  12/10/2011    Procedure: INSERTION OF ARTERIOVENOUS (AV) GORE-TEX GRAFT ARM;  Surgeon: Angelia Mould, MD;  Location: [place];  Service: Vascular;  Laterality: Right;  . Fistulogram Left 07/05/2011    Procedure: FISTULOGRAM;  Surgeon: Angelia Mould, MD;  Location: [facility] CATH LAB;  Service: Cardiovascular;  Laterality: Left;    Allergies: Sulfa antibiotics  Medications: Prior to Admission medications   Medication Sig Start Date End Date Taking? Authorizing Provider  acetaminophen (TYLENOL) 500 MG tablet Take 500 mg by mouth every 4 (four) hours as needed for moderate pain or headache.     Historical Provider, MD  calcium carbonate  (TUMS - DOSED IN MG ELEMENTAL CALCIUM) 500 MG chewable tablet Chew 1 tablet by mouth 3 (three) times daily before meals. Also before snacks    Historical Provider, MD  cetirizine (ZYRTEC) 5 MG tablet Take 5 mg by mouth daily.    Historical Provider, MD  digoxin (LANOXIN) 0.125 MG tablet Take 125 mcg by mouth 3 (three) times a week. Monday, Wednesday, Friday    Historical Provider, MD  folic acid-vitamin b complex-vitamin c-selenium-zinc (DIALYVITE) 3 MG TABS Take 1 tablet by mouth daily.    Historical Provider, MD  glipiZIDE (GLUCOTROL) 10 MG tablet Take 10 mg by mouth 2 (two) times daily before a meal.    Historical Provider, MD  Hypromellose (NATURES TEARS OP) Apply 1 drop to eye 2 (two) times daily as needed (irritated eyes).     Historical Provider, MD  Insulin Glargine (LANTUS) 100 UNIT/ML Solostar Pen Inject 40 Units into the skin daily at 10 pm.    Historical Provider, MD  Lidocaine-Prilocaine, Bulk, 2.5-2.5 % CREA Apply 1 application topically 3 (three) times a week.     Historical Provider, MD  metoprolol tartrate (LOPRESSOR) 25 MG tablet Take 25 mg by mouth 2 (two) times daily.    Historical Provider, MD  miconazole (MICOTIN) 2 % powder Apply topically as needed for itching. 1 app applied topically bid prn to abdominal folds  Historical Provider, MD  simethicone (MYLICON) 80 MG chewable tablet Chew 80 mg by mouth 4 (four) times daily as needed for flatulence.     Historical Provider, MD  warfarin (COUMADIN) 4 MG tablet Take 4-8 mg by mouth daily at 6 PM. Take 8 mg on Monday, Wed, and Friday. Take 4 mg on all other days.    Historical Provider, MD     Family History  Problem Relation Age of Onset  . Anesthesia problems Neg Hx   . Hypotension Neg Hx   . Malignant hyperthermia Neg Hx   . Pseudochol deficiency Neg Hx   . Cancer Mother   . Heart disease Father   . Cancer Sister     Social History   Social History  . Marital Status: Legally Separated    Spouse Name: N/A  . Number  of Children: N/A  . Years of Education: N/A   Social History Main Topics  . Smoking status: Former Smoker -- 20 years    Types: Cigarettes    Quit date: 05/31/2010  . Smokeless tobacco: Never Used  . Alcohol Use: No  . Drug Use: No  . Sexual Activity: Not Currently    Birth Control/ Protection: Post-menopausal   Other Topics Concern  . Not on file   Social History Narrative     Review of Systems: A 12 point ROS discussed and pertinent positives are indicated in the HPI above.  All other systems are negative.  Review of Systems  Vital Signs: There were no vitals taken for this visit.  Physical Exam  Constitutional: She is oriented to person, place, and time. She appears well-developed and well-nourished.  Cardiovascular: Normal rate and regular rhythm.   Pulmonary/Chest: Effort normal and breath sounds normal.  Neurological: She is alert and oriented to person, place, and time.  Skin: Skin is warm and dry.    Mallampati Score:  2    Imaging: No results found.  Labs:  CBC: No results for input(s): WBC, HGB, HCT, PLT in the last 8760 hours.  COAGS: No results for input(s): INR, APTT in the last 8760 hours.  BMP: No results for input(s): NA, K, CL, CO2, GLUCOSE, BUN, CALCIUM, CREATININE, GFRNONAA, GFRAA in the last 8760 hours.  Invalid input(s): CMP  LIVER FUNCTION TESTS: No results for input(s): BILITOT, AST, ALT, ALKPHOS, PROT, ALBUMIN in the last 8760 hours.  TUMOR MARKERS: No results for input(s): AFPTM, CEA, CA199, CHROMGRNA in the last 8760 hours.  Assessment and Plan:  Shuntogram and venous angioplasty.  Thank you for this interesting consult.  I greatly enjoyed meeting Kelly Gallegos and look forward to participating in their care.  A copy of this report was sent to the requesting provider on this date.  Signed: Ilan Kahrs, ART A 04/30/2015, 10:20 AM   I spent a total of   10 Minutes in face to face in clinical consultation, greater than 50%  of which was counseling/coordinating care for venous stenosis.

## 2015-04-30 NOTE — Procedures (Signed)
RUE AV shuntogram PTA venous 7 mm No comp/EBL

## 2015-07-24 ENCOUNTER — Encounter: Payer: Self-pay | Admitting: Family

## 2015-08-01 ENCOUNTER — Other Ambulatory Visit: Payer: Self-pay | Admitting: Nurse Practitioner

## 2015-08-01 ENCOUNTER — Ambulatory Visit (HOSPITAL_COMMUNITY)
Admission: RE | Admit: 2015-08-01 | Discharge: 2015-08-01 | Disposition: A | Discharge status: Home / Self Care | Payer: Medicare (Managed Care) | Source: Ambulatory Visit | Attending: Family | Admitting: Family

## 2015-08-01 ENCOUNTER — Ambulatory Visit
Admission: RE | Admit: 2015-08-01 | Discharge: 2015-08-01 | Disposition: A | Discharge status: Home / Self Care | Payer: Medicare (Managed Care) | Source: Ambulatory Visit | Attending: Nurse Practitioner | Admitting: Nurse Practitioner

## 2015-08-01 ENCOUNTER — Encounter: Payer: Self-pay | Admitting: Family

## 2015-08-01 ENCOUNTER — Ambulatory Visit (INDEPENDENT_AMBULATORY_CARE_PROVIDER_SITE_OTHER): Payer: Medicare (Managed Care) | Admitting: Family

## 2015-08-01 VITALS — BP 141/72 | HR 109 | Ht 63.0 in | Wt 191.9 lb

## 2015-08-01 DIAGNOSIS — Z8739 Personal history of other diseases of the musculoskeletal system and connective tissue: Secondary | ICD-10-CM | POA: Diagnosis not present

## 2015-08-01 DIAGNOSIS — E1151 Type 2 diabetes mellitus with diabetic peripheral angiopathy without gangrene: Secondary | ICD-10-CM

## 2015-08-01 DIAGNOSIS — I130 Hypertensive heart and chronic kidney disease with heart failure and stage 1 through stage 4 chronic kidney disease, or unspecified chronic kidney disease: Secondary | ICD-10-CM | POA: Insufficient documentation

## 2015-08-01 DIAGNOSIS — R0989 Other specified symptoms and signs involving the circulatory and respiratory systems: Secondary | ICD-10-CM | POA: Diagnosis present

## 2015-08-01 DIAGNOSIS — N189 Chronic kidney disease, unspecified: Secondary | ICD-10-CM | POA: Insufficient documentation

## 2015-08-01 DIAGNOSIS — I70219 Atherosclerosis of native arteries of extremities with intermittent claudication, unspecified extremity: Secondary | ICD-10-CM | POA: Diagnosis not present

## 2015-08-01 DIAGNOSIS — N186 End stage renal disease: Secondary | ICD-10-CM | POA: Diagnosis not present

## 2015-08-01 DIAGNOSIS — R938 Abnormal findings on diagnostic imaging of other specified body structures: Secondary | ICD-10-CM | POA: Insufficient documentation

## 2015-08-01 DIAGNOSIS — Z992 Dependence on renal dialysis: Secondary | ICD-10-CM

## 2015-08-01 DIAGNOSIS — R059 Cough, unspecified: Secondary | ICD-10-CM

## 2015-08-01 DIAGNOSIS — R05 Cough: Secondary | ICD-10-CM

## 2015-08-01 DIAGNOSIS — I509 Heart failure, unspecified: Secondary | ICD-10-CM | POA: Insufficient documentation

## 2015-08-01 DIAGNOSIS — E1122 Type 2 diabetes mellitus with diabetic chronic kidney disease: Secondary | ICD-10-CM | POA: Insufficient documentation

## 2015-08-01 DIAGNOSIS — I779 Disorder of arteries and arterioles, unspecified: Secondary | ICD-10-CM | POA: Diagnosis not present

## 2015-08-01 NOTE — Patient Instructions (Signed)
Peripheral Vascular Disease Peripheral vascular disease (PVD) is a disease of the blood vessels that are not part of your heart and brain. A simple term for PVD is poor circulation. In most cases, PVD narrows the blood vessels that carry blood from your heart to the rest of your body. This can result in a decreased supply of blood to your arms, legs, and internal organs, like your stomach or kidneys. However, it most often affects a person's lower legs and feet. There are two types of PVD.  Organic PVD. This is the more common type. It is caused by damage to the structure of blood vessels.  Functional PVD. This is caused by conditions that make blood vessels contract and tighten (spasm). Without treatment, PVD tends to get worse over time. PVD can also lead to acute ischemic limb. This is when an arm or limb suddenly has trouble getting enough blood. This is a medical emergency. CAUSES Each type of PVD has many different causes. The most common cause of PVD is buildup of a fatty material (plaque) inside of your arteries (atherosclerosis). Small amounts of plaque can break off from the walls of the blood vessels and become lodged in a smaller artery. This blocks blood flow and can cause acute ischemic limb. Other common causes of PVD include:  Blood clots that form inside of blood vessels.  Injuries to blood vessels.  Diseases that cause inflammation of blood vessels or cause blood vessel spasms.  Health behaviors and health history that increase your risk of developing PVD. RISK FACTORS  You may have a greater risk of PVD if you:  Have a family history of PVD.  Have certain medical conditions, including:  High cholesterol.  Diabetes.  High blood pressure (hypertension).  Coronary heart disease.  Past problems with blood clots.  Past injury, such as burns or a broken bone. These may have damaged blood vessels in your limbs.  Buerger disease. This is caused by inflamed blood  vessels in your hands and feet.  Some forms of arthritis.  Rare birth defects that affect the arteries in your legs.  Use tobacco.  Do not get enough exercise.  Are obese.  Are age 50 or older. SIGNS AND SYMPTOMS  PVD may cause many different symptoms. Your symptoms depend on what part of your body is not getting enough blood. Some common signs and symptoms include:  Cramps in your lower legs. This may be a symptom of poor leg circulation (claudication).  Pain and weakness in your legs while you are physically active that goes away when you rest (intermittent claudication).  Leg pain when at rest.  Leg numbness, tingling, or weakness.  Coldness in a leg or foot, especially when compared with the other leg.  Skin or hair changes. These can include:  Hair loss.  Shiny skin.  Pale or bluish skin.  Thick toenails.  Inability to get or maintain an erection (erectile dysfunction). People with PVD are more prone to developing ulcers and sores on their toes, feet, or legs. These may take longer than normal to heal. DIAGNOSIS Your health care provider may diagnose PVD from your signs and symptoms. The health care provider will also do a physical exam. You may have tests to find out what is causing your PVD and determine its severity. Tests may include:  Blood pressure recordings from your arms and legs and measurements of the strength of your pulses (pulse volume recordings).  Imaging studies using sound waves to take pictures of   the blood flow through your blood vessels (Doppler ultrasound).  Injecting a dye into your blood vessels before having imaging studies using:  X-rays (angiogram or arteriogram).  Computer-generated X-rays (CT angiogram).  A powerful electromagnetic field and a computer (magnetic resonance angiogram or MRA). TREATMENT Treatment for PVD depends on the cause of your condition and the severity of your symptoms. It also depends on your age. Underlying  causes need to be treated and controlled. These include long-lasting (chronic) conditions, such as diabetes, high cholesterol, and high blood pressure. You may need to first try making lifestyle changes and taking medicines. Surgery may be needed if these do not work. Lifestyle changes may include:  Quitting smoking.  Exercising regularly.  Following a low-fat, low-cholesterol diet. Medicines may include:  Blood thinners to prevent blood clots.  Medicines to improve blood flow.  Medicines to improve your blood cholesterol levels. Surgical procedures may include:  A procedure that uses an inflated balloon to open a blocked artery and improve blood flow (angioplasty).  A procedure to put in a tube (stent) to keep a blocked artery open (stent implant).  Surgery to reroute blood flow around a blocked artery (peripheral bypass surgery).  Surgery to remove dead tissue from an infected wound on the affected limb.  Amputation. This is surgical removal of the affected limb. This may be necessary in cases of acute ischemic limb that are not improved through medical or surgical treatments. HOME CARE INSTRUCTIONS  Take medicines only as directed by your health care provider.  Do not use any tobacco products, including cigarettes, chewing tobacco, or electronic cigarettes. If you need help quitting, ask your health care provider.  Lose weight if you are overweight, and maintain a healthy weight as directed by your health care provider.  Eat a diet that is low in fat and cholesterol. If you need help, ask your health care provider.  Exercise regularly. Ask your health care provider to suggest some good activities for you.  Use compression stockings or other mechanical devices as directed by your health care provider.  Take good care of your feet.  Wear comfortable shoes that fit well.  Check your feet often for any cuts or sores. SEEK MEDICAL CARE IF:  You have cramps in your legs  while walking.  You have leg pain when you are at rest.  You have coldness in a leg or foot.  Your skin changes.  You have erectile dysfunction.  You have cuts or sores on your feet that are not healing. SEEK IMMEDIATE MEDICAL CARE IF:  Your arm or leg turns cold and blue.  Your arms or legs become red, warm, swollen, painful, or numb.  You have chest pain or trouble breathing.  You suddenly have weakness in your face, arm, or leg.  You become very confused or lose the ability to speak.  You suddenly have a very bad headache or lose your vision.   This information is not intended to replace advice given to you by your health care provider. Make sure you discuss any questions you have with your health care provider.   Document Released: 06/24/2004 Document Revised: 06/07/2014 Document Reviewed: 10/25/2013 [employer] Interactive Patient Education 2016 [employer]  

## 2015-08-01 NOTE — Progress Notes (Signed)
[facility] OF [place] HISTORY AND PHYSICAL -PAD  History of Present Illness Kelly Gallegos is a 65 y.o. female patient of Dr. Bridgett Larsson who presents with chief complaint: chronic back pain. The patient's back pain has improved. These sx are related to her episode of necrotizing fasciitis. Her intermittent claudication is limited as her back pain limits her ambulation. She denies any foot wounds or rest pain at this point. The patient's treatment regimen currently includes: maximal medical management. She is unable to comply with a walking plan.   She does receive physical therapy with [facility] twice/week. 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  She dialyzes T-TH-SAT via right arm graft.   Pt Diabetic: Yes, 7.5 pt states was her last A1C Pt smoker: former smoker, quit in 2012  Pt meds include: Statin :No Betablocker: Yes ASA: No Other anticoagulants/antiplatelets: warfarin   Past Medical History  Diagnosis Date  . Diabetes mellitus     Type II  . Hypertension   . Atrial fibrillation ([place])   . Anemia     Anemia of chronic disease  . Thyroid disease     Secondary Hyperparathyroidism  . Necrotizing fasciitis ([place]) 2012  . CHF (congestive heart failure) ([place])   . Shortness of breath   . Chronic kidney disease     T TH SAT [address]  . Pneumonia 2010  . DVT (deep venous thrombosis) ([place]) approx 1999  . Pulmonary embolus ([facility]) approx 1999  . Cardiomyopathy ([place])     presumed non-ischemic due to no ischemia on stress 07/2010 [facility])    Social History Social History  Substance Use Topics  . Smoking status: Former Smoker -- 20 years    Types: Cigarettes    Quit date: 05/31/2010  . Smokeless tobacco: Never Used  . Alcohol Use: No    Family History Family History  Problem Relation Age of Onset  . Anesthesia problems Neg Hx   . Hypotension Neg Hx   . Malignant hyperthermia Neg Hx   . Pseudochol deficiency Neg Hx   . Cancer  Mother   . Heart disease Father   . Cancer Sister     Past Surgical History  Procedure Laterality Date  . Av fistula placement  02/10/2011    Left Brachiocephalic Arteriovenous Fistula Formation  . Back surgery  10-2010    pt. 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 Avf revision  07-23-2011    left  . Salivary gland surgery      stone removed  . Cesarean section    . Av fistula placement  12/10/2011    Procedure: INSERTION OF ARTERIOVENOUS (AV) GORE-TEX GRAFT ARM;  Surgeon: Angelia Mould, MD;  Location: [facility];  Service: Vascular;  Laterality: Right;  . Fistulogram Left 07/05/2011    Procedure: FISTULOGRAM;  Surgeon: Angelia Mould, MD;  Location: [facility] CATH LAB;  Service: Cardiovascular;  Laterality: Left;    Allergies  Allergen Reactions  . Sulfa Antibiotics Other (See Comments)    Unknown-childhood allergy    Current Outpatient Prescriptions  Medication Sig Dispense Refill  . acetaminophen (TYLENOL) 500 MG tablet Take 500 mg by mouth every 4 (four) hours as needed for moderate pain or headache.     . calcium carbonate (TUMS - DOSED IN MG ELEMENTAL CALCIUM) 500 MG chewable tablet Chew 1 tablet by mouth 3 (three) times daily before meals. Also before snacks    .  cetirizine (ZYRTEC) 5 MG tablet Take 5 mg by mouth daily.    . digoxin (LANOXIN) 0.125 MG tablet Take 125 mcg by mouth 3 (three) times a week. Monday, Wednesday, Friday    . folic acid-vitamin b complex-vitamin c-selenium-zinc (DIALYVITE) 3 MG TABS Take 1 tablet by mouth daily.    Mar[place] Kitchen glipiZIDE (GLUCOTROL) 10 MG tablet Take 10 mg by mouth 2 (two) times daily before a meal.    . Hypromellose (NATURES TEARS OP) Apply 1 drop to eye 2 (two) times daily as needed (irritated eyes).     . Insulin Glargine (LANTUS) 100 UNIT/ML Solostar Pen Inject 40 Units into the skin daily at 10 pm.    . Lidocaine-Prilocaine, Bulk, 2.5-2.5 % CREA Apply 1 application topically 3 (three) times a  week.     . metoprolol tartrate (LOPRESSOR) 25 MG tablet Take 25 mg by mouth 2 (two) times daily.    . miconazole (MICOTIN) 2 % powder Apply topically as needed for itching. 1 app applied topically bid prn to abdominal folds    . simethicone (MYLICON) 80 MG chewable tablet Chew 80 mg by mouth 4 (four) times daily as needed for flatulence.     . warfarin (COUMADIN) 4 MG tablet Take 4-8 mg by mouth daily at 6 PM. Take 8 mg on Monday, Wed, and Friday. Take 4 mg on all other days.     No current facility-administered medications for this visit.    ROS: See HPI for pertinent positives and negatives.   Physical Examination  Filed Vitals:   08/01/15 1124 08/01/15 1126  BP: 145/88 141/72  Pulse: 109   Height: 5\' 3"  (1.6 m)   Weight: 191 lb 14.4 oz (87.045 kg)   SpO2: 97%    Body mass index is 34 kg/(m^2).    General: A&O x 3, WD, obese female   Pulmonary: Sym exp, limited air movt, no rales or rhonchi, + few wheezes  Cardiac: RRR, Nl S1, S2, no detected murmur  Vascular: Vessel Right Left  Radial Palpable Palpable  Brachial Palpable Palpable  Carotid Palpable, without bruit Palpable, without bruit  Aorta Not palpable N/A  Femoral Palpable Palpable  Popliteal Not palpable Not palpable  PT Not Palpable Not Palpable  DP Not Palpable  Palpable   Gastrointestinal: soft, NTND, no G/R, no HSM, no palpable masses, no CVAT B  Musculoskeletal: M/S 5/5 in upper extremities, 4/5 in LE's, Extremities without ischemic changes , palpable thrill in R FA AVG, bilateral leg with bronze appearance, no rubor, no ulcers or gangrene  Neurologic: Pain and light touch intact in extremities , Motor exam as listed above         Non-Invasive Vascular Imaging: DATE: 08/01/2015 ABI  R: 1.3 ([place], 01/24/15), DP: biphasic, PT: biphasic, TBI: 0.61 (0.40)  L: 1.3 (1.4), DP: biphasic, PT: monophasic, TBI: 0.61 (0.50)   ASSESSMENT: Kelly Gallegos is a 65 y.o. female with  chronic back pain related to necrotizing fasciitis, RLE severe PAD, LLE mod-severe PAD, minimal intermittent claudication without evidence of critical limb ischemia.  ABI's have improved compared to August of 2016; at that time all waveforms were monophasic, today most waveforms are biphasic; bilateral toe brachial indices have improved. This improvement is most likely due to the twice weekly physical therapy she receives with [facility].   Pt remains asx due to limited ambulation due to her back. Subsequently, it is hard to justify the risks of angiography at this point (Dr. Lianne Moris assessment from 01/24/15).  PLAN:  Based on the patient's vascular studies and examination, pt will return to clinic in 1 year with ABI's.  She knows to return sooner if she develops concerns re the circulation in her legs.   I discussed in depth with the patient the nature of atherosclerosis, and emphasized the importance of maximal medical management including strict control of blood pressure, blood glucose, and lipid levels, obtaining regular exercise, and continued cessation of smoking.  The patient is aware that without maximal medical management the underlying atherosclerotic disease process will progress, limiting the benefit of any interventions.  The patient was given information about PAD including signs, symptoms, treatment, what symptoms should prompt the patient to seek immediate medical care, and risk reduction measures to take.  Clemon Chambers, RN, MSN, FNP-C [facility] [employer] Phone: [phone]  Clinic MD: Bridgett Larsson  08/01/2015 12:07 PM

## 2015-08-04 ENCOUNTER — Other Ambulatory Visit: Payer: Self-pay | Admitting: *Deleted

## 2015-08-04 DIAGNOSIS — I739 Peripheral vascular disease, unspecified: Secondary | ICD-10-CM

## 2015-08-12 ENCOUNTER — Other Ambulatory Visit (HOSPITAL_COMMUNITY): Payer: Self-pay | Admitting: Nephrology

## 2015-08-12 DIAGNOSIS — N186 End stage renal disease: Secondary | ICD-10-CM

## 2015-08-12 DIAGNOSIS — Z992 Dependence on renal dialysis: Principal | ICD-10-CM

## 2015-08-20 ENCOUNTER — Other Ambulatory Visit (HOSPITAL_COMMUNITY): Payer: Self-pay | Admitting: Nephrology

## 2015-08-20 ENCOUNTER — Ambulatory Visit (HOSPITAL_COMMUNITY)
Admission: RE | Admit: 2015-08-20 | Discharge: 2015-08-20 | Disposition: A | Discharge status: Home / Self Care | Payer: Medicare (Managed Care) | Source: Ambulatory Visit | Attending: Nephrology | Admitting: Nephrology

## 2015-08-20 DIAGNOSIS — T82858A Stenosis of vascular prosthetic devices, implants and grafts, initial encounter: Secondary | ICD-10-CM | POA: Diagnosis not present

## 2015-08-20 DIAGNOSIS — Z992 Dependence on renal dialysis: Principal | ICD-10-CM

## 2015-08-20 DIAGNOSIS — N186 End stage renal disease: Secondary | ICD-10-CM

## 2015-08-20 DIAGNOSIS — Y832 Surgical operation with anastomosis, bypass or graft as the cause of abnormal reaction of the patient, or of later complication, without mention of misadventure at the time of the procedure: Secondary | ICD-10-CM | POA: Diagnosis not present

## 2015-08-20 MED ORDER — LIDOCAINE HCL 1 % IJ SOLN
INTRAMUSCULAR | Status: AC
  Filled 2015-08-20: qty 20

## 2015-08-20 MED ORDER — IOPAMIDOL (ISOVUE-370) INJECTION 76%
150.0000 mL | Freq: Once | INTRAVENOUS | Status: AC | PRN
  Administered 2015-08-20: 50 mL via INTRAVENOUS

## 2015-09-01 ENCOUNTER — Telehealth: Payer: Self-pay

## 2015-09-01 NOTE — Telephone Encounter (Signed)
Phone call from [facility]; Dr. Bradd Burner requested an appt. today due to c/o bleeding at (R) FA AVG site over the weekend.  Reported the bleeding came from venipuncture site where AVG was accessed for HD.  Denied any active bleeding at this time.  Stated there is no open sore/ ulcer at the right FA AVG site.  Noted that pt's INR 4.1 today.  Discussed with Dr. Trula Slade.  Per Dr. Trula Slade, since pt. had a Right Arm Shuntogram with intervention on 08/20/15, and the INR is now high, then should wait until the INR is lower, and determine if there is still a need to evaluate.  Notified Davy Pique, nurse @ [facility].  Stated she will give this information to Dr. Bradd Burner.

## 2015-12-24 ENCOUNTER — Other Ambulatory Visit (HOSPITAL_COMMUNITY): Payer: Self-pay | Admitting: Nurse Practitioner

## 2015-12-24 DIAGNOSIS — Z992 Dependence on renal dialysis: Principal | ICD-10-CM

## 2015-12-24 DIAGNOSIS — N186 End stage renal disease: Secondary | ICD-10-CM

## 2015-12-29 ENCOUNTER — Encounter (HOSPITAL_COMMUNITY): Payer: Self-pay | Admitting: Interventional Radiology

## 2015-12-29 ENCOUNTER — Ambulatory Visit (HOSPITAL_COMMUNITY)
Admission: RE | Admit: 2015-12-29 | Discharge: 2015-12-29 | Disposition: A | Discharge status: Home / Self Care | Payer: Medicare (Managed Care) | Source: Ambulatory Visit | Attending: Nurse Practitioner | Admitting: Nurse Practitioner

## 2015-12-29 ENCOUNTER — Other Ambulatory Visit (HOSPITAL_COMMUNITY): Payer: Self-pay | Admitting: Nurse Practitioner

## 2015-12-29 DIAGNOSIS — Z992 Dependence on renal dialysis: Principal | ICD-10-CM

## 2015-12-29 DIAGNOSIS — Y832 Surgical operation with anastomosis, bypass or graft as the cause of abnormal reaction of the patient, or of later complication, without mention of misadventure at the time of the procedure: Secondary | ICD-10-CM | POA: Insufficient documentation

## 2015-12-29 DIAGNOSIS — T82858A Stenosis of vascular prosthetic devices, implants and grafts, initial encounter: Secondary | ICD-10-CM | POA: Diagnosis present

## 2015-12-29 DIAGNOSIS — N186 End stage renal disease: Secondary | ICD-10-CM

## 2015-12-29 HISTORY — PX: IR GENERIC HISTORICAL: IMG1180011

## 2015-12-29 MED ORDER — LIDOCAINE HCL 1 % IJ SOLN
INTRAMUSCULAR | Status: AC
  Administered 2015-12-29: 3 mL
  Filled 2015-12-29: qty 20

## 2015-12-29 MED ORDER — IOPAMIDOL (ISOVUE-300) INJECTION 61%
INTRAVENOUS | Status: AC
  Administered 2015-12-29: 65 mL
  Filled 2015-12-29: qty 100

## 2015-12-29 NOTE — Procedures (Signed)
Right FA dialysis shuntogram and venous PTA x2 No complication No blood loss. See complete dictation in [facility].

## 2016-06-21 ENCOUNTER — Other Ambulatory Visit (HOSPITAL_COMMUNITY): Payer: Self-pay | Admitting: Nephrology

## 2016-06-21 DIAGNOSIS — N186 End stage renal disease: Secondary | ICD-10-CM

## 2016-06-25 ENCOUNTER — Ambulatory Visit (HOSPITAL_COMMUNITY)
Admission: RE | Admit: 2016-06-25 | Discharge: 2016-06-25 | Disposition: A | Discharge status: Home / Self Care | Payer: Medicare (Managed Care) | Source: Ambulatory Visit | Attending: Nephrology | Admitting: Nephrology

## 2016-06-25 ENCOUNTER — Encounter (HOSPITAL_COMMUNITY): Payer: Self-pay | Admitting: Interventional Radiology

## 2016-06-25 ENCOUNTER — Other Ambulatory Visit (HOSPITAL_COMMUNITY): Payer: Self-pay | Admitting: Nephrology

## 2016-06-25 DIAGNOSIS — N186 End stage renal disease: Secondary | ICD-10-CM | POA: Insufficient documentation

## 2016-06-25 DIAGNOSIS — T82858A Stenosis of vascular prosthetic devices, implants and grafts, initial encounter: Secondary | ICD-10-CM | POA: Diagnosis not present

## 2016-06-25 DIAGNOSIS — Z992 Dependence on renal dialysis: Secondary | ICD-10-CM | POA: Insufficient documentation

## 2016-06-25 DIAGNOSIS — Y832 Surgical operation with anastomosis, bypass or graft as the cause of abnormal reaction of the patient, or of later complication, without mention of misadventure at the time of the procedure: Secondary | ICD-10-CM | POA: Diagnosis not present

## 2016-06-25 HISTORY — PX: IR GENERIC HISTORICAL: IMG1180011

## 2016-06-25 MED ORDER — IOPAMIDOL (ISOVUE-300) INJECTION 61%
INTRAVENOUS | Status: AC
  Administered 2016-06-25: 30 mL
  Filled 2016-06-25: qty 100

## 2016-06-25 MED ORDER — LIDOCAINE HCL (PF) 1 % IJ SOLN
INTRAMUSCULAR | Status: AC
  Filled 2016-06-25: qty 30

## 2016-06-25 MED ORDER — LIDOCAINE HCL (PF) 1 % IJ SOLN
INTRAMUSCULAR | Status: DC | PRN
  Administered 2016-06-25: 5 mL

## 2016-06-25 NOTE — Procedures (Signed)
S/p Rt forearm shuntogram and 65mm venous PTA  No comp Stable Full report in PACS

## 2016-07-08 ENCOUNTER — Other Ambulatory Visit (HOSPITAL_COMMUNITY): Payer: Self-pay | Admitting: Nephrology

## 2016-07-08 DIAGNOSIS — N186 End stage renal disease: Secondary | ICD-10-CM

## 2016-07-08 DIAGNOSIS — Z992 Dependence on renal dialysis: Principal | ICD-10-CM

## 2016-07-12 ENCOUNTER — Encounter (HOSPITAL_COMMUNITY): Payer: Self-pay | Admitting: Interventional Radiology

## 2016-07-12 ENCOUNTER — Ambulatory Visit (HOSPITAL_COMMUNITY)
Admission: RE | Admit: 2016-07-12 | Discharge: 2016-07-12 | Disposition: A | Discharge status: Home / Self Care | Payer: Medicare (Managed Care) | Source: Ambulatory Visit | Attending: Nephrology | Admitting: Nephrology

## 2016-07-12 ENCOUNTER — Other Ambulatory Visit (HOSPITAL_COMMUNITY): Payer: Self-pay | Admitting: Nephrology

## 2016-07-12 DIAGNOSIS — N186 End stage renal disease: Secondary | ICD-10-CM | POA: Diagnosis not present

## 2016-07-12 DIAGNOSIS — Y832 Surgical operation with anastomosis, bypass or graft as the cause of abnormal reaction of the patient, or of later complication, without mention of misadventure at the time of the procedure: Secondary | ICD-10-CM | POA: Insufficient documentation

## 2016-07-12 DIAGNOSIS — T82858A Stenosis of vascular prosthetic devices, implants and grafts, initial encounter: Secondary | ICD-10-CM | POA: Diagnosis present

## 2016-07-12 DIAGNOSIS — Z992 Dependence on renal dialysis: Secondary | ICD-10-CM | POA: Insufficient documentation

## 2016-07-12 HISTORY — PX: IR GENERIC HISTORICAL: IMG1180011

## 2016-07-12 MED ORDER — LIDOCAINE HCL 1 % IJ SOLN
INTRAMUSCULAR | Status: DC | PRN
  Administered 2016-07-12: 10 mL

## 2016-07-12 MED ORDER — IOPAMIDOL (ISOVUE-300) INJECTION 61%
INTRAVENOUS | Status: AC
  Administered 2016-07-12: 50 mL
  Filled 2016-07-12: qty 100

## 2016-07-12 MED ORDER — LIDOCAINE HCL (PF) 1 % IJ SOLN
INTRAMUSCULAR | Status: AC
  Filled 2016-07-12: qty 30

## 2016-07-12 NOTE — Procedures (Signed)
Pre Procedure Dx: ESRD Post Procedure Dx: Same  Technically successful fistulogram with angioplasty.    EBL: Minimal  No immediate complications.  Jay Rashawna Scoles, MD Pager #: [phone]  

## 2016-08-06 ENCOUNTER — Ambulatory Visit: Payer: Medicare (Managed Care) | Admitting: Family

## 2016-08-06 ENCOUNTER — Encounter (HOSPITAL_COMMUNITY): Payer: Medicare (Managed Care)

## 2016-08-09 IMAGING — CR DG CHEST 2V
2 series · 2 of 2 positions shown · non-contrast
Comparison: PA and lateral chest x-ray dated November 15, 2012

CLINICAL DATA: One month of persistent cough, history of end-stage
renal disease diabetes, CHF, former smoker.

EXAM:
CHEST  2 VIEW

[w chest lat]
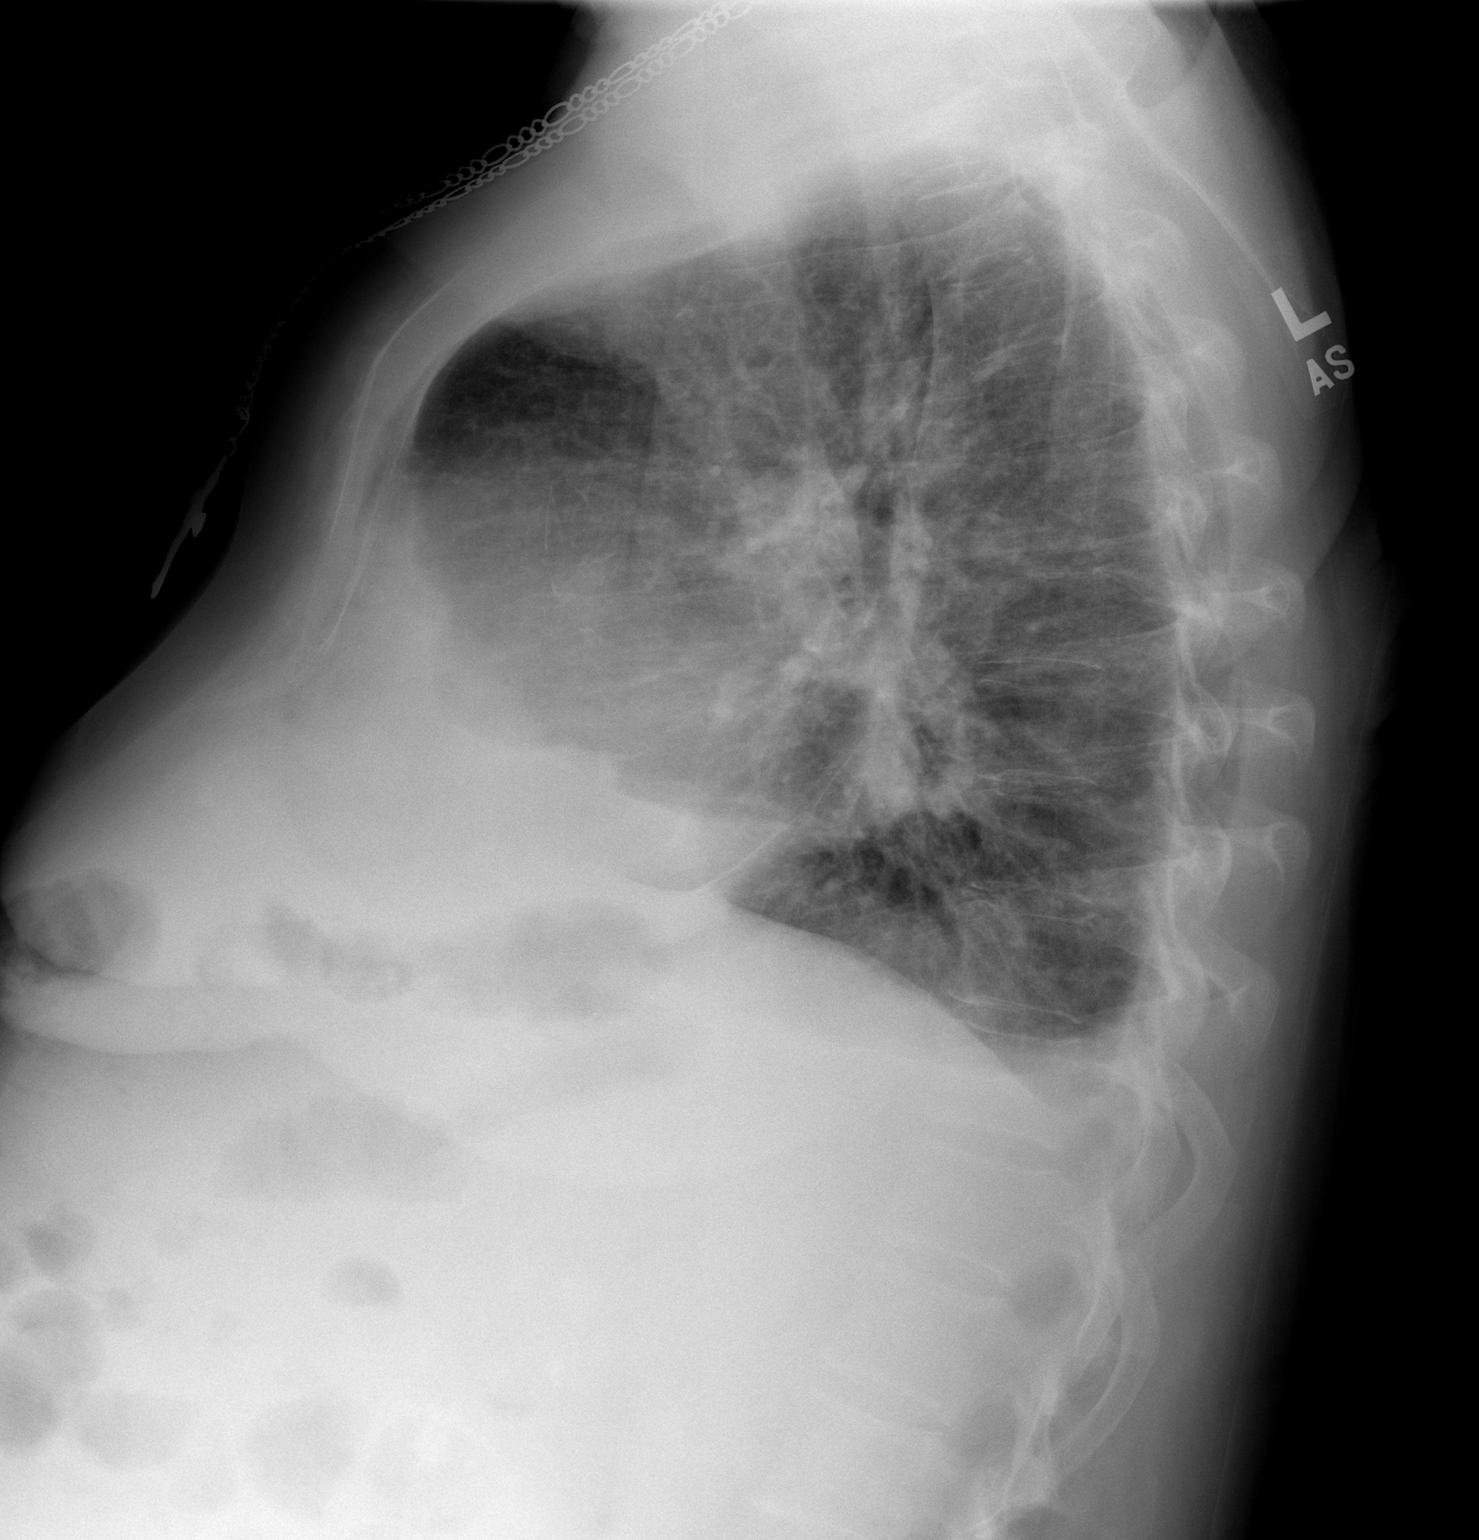

[w chest ap]
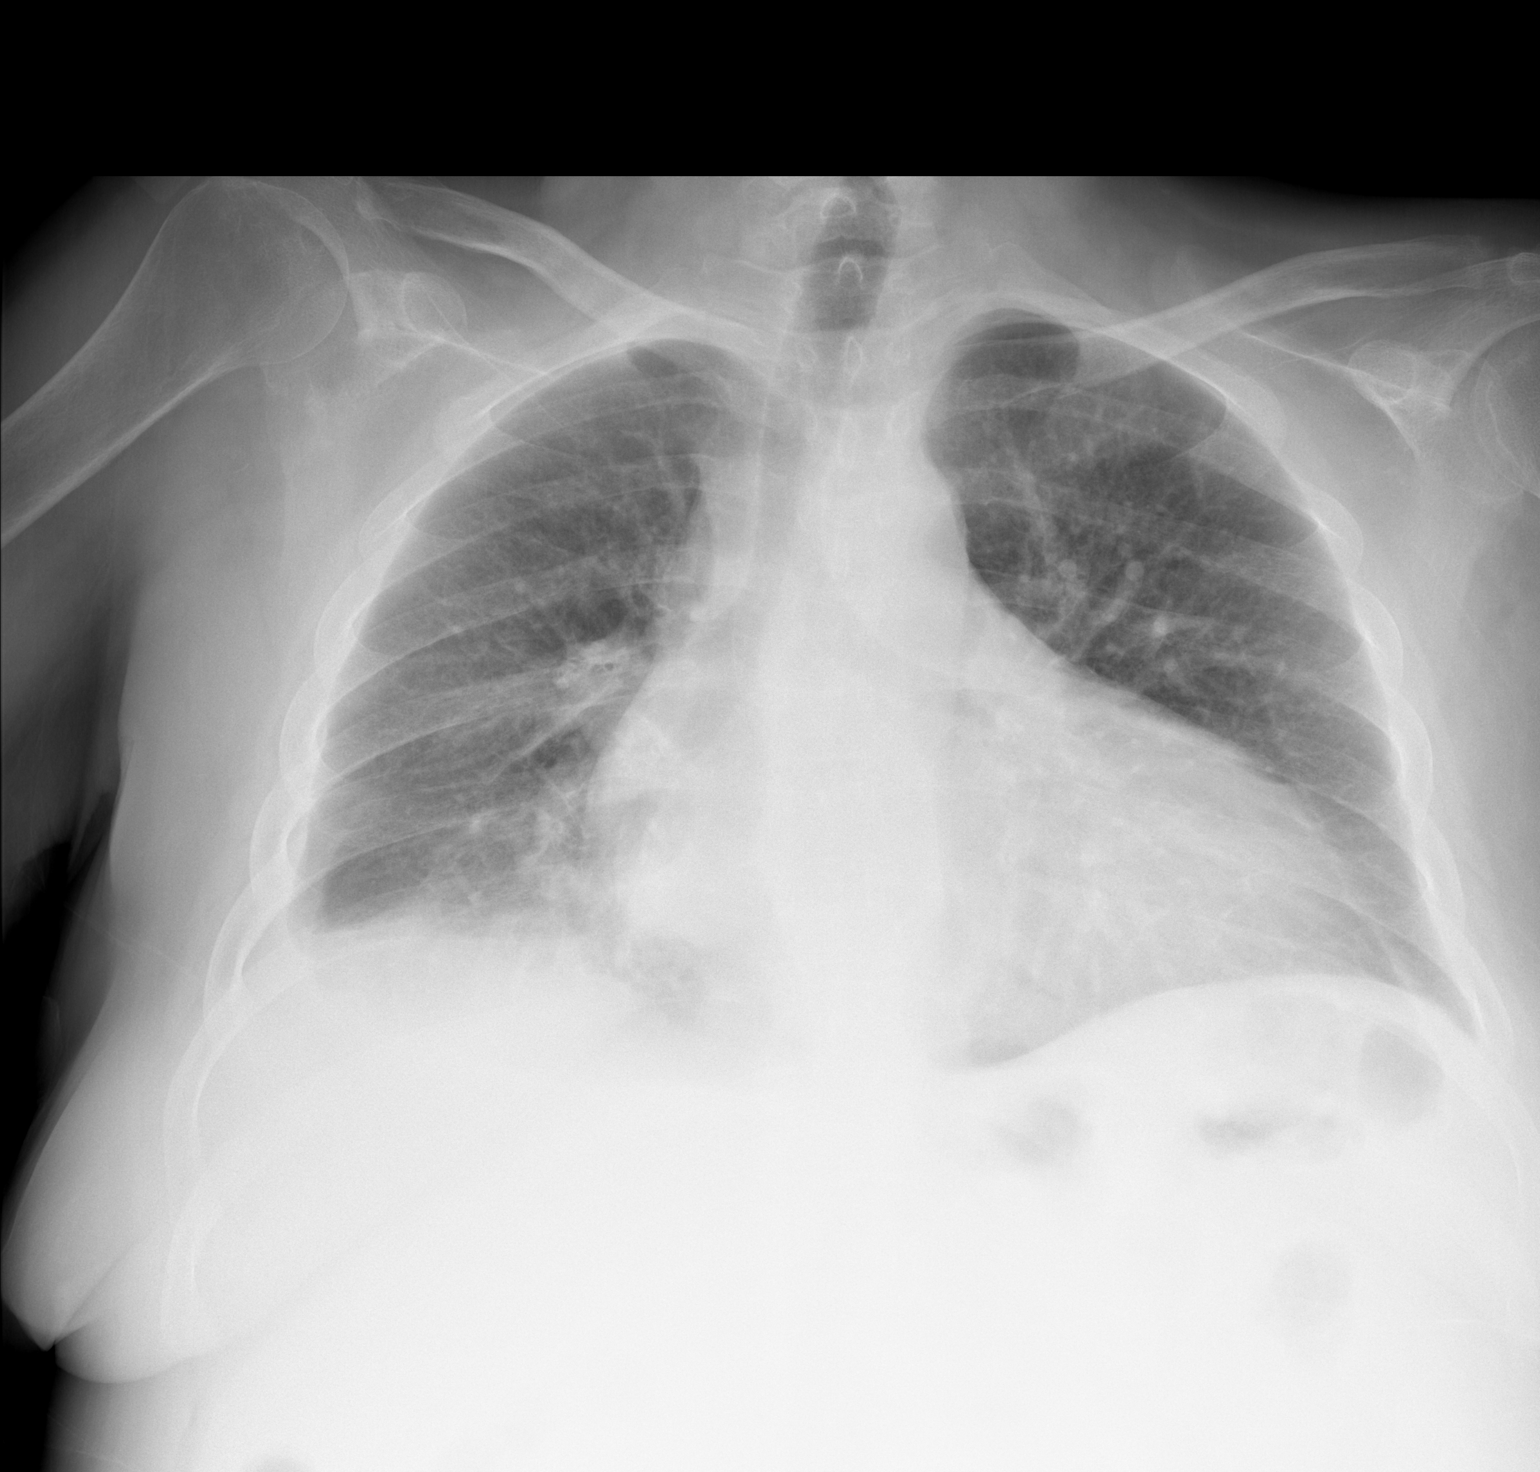

[2 of 2 positions shown; findings below may reference images not displayed]

FINDINGS: The lungs remain mildly hypoinflated. There is a small right pleural
effusion. The cardiac silhouette remains enlarged. The pulmonary
vascularity is engorged centrally. The pulmonary interstitial
markings are mildly increased. The mediastinum is normal in width.
The bony thorax exhibits no acute abnormality.
IMPRESSION: CHF with mild pulmonary interstitial edema and small right pleural
effusion. There is no focal pneumonia.

## 2016-11-16 ENCOUNTER — Other Ambulatory Visit (HOSPITAL_COMMUNITY): Payer: Self-pay | Admitting: Nephrology

## 2016-11-16 DIAGNOSIS — N186 End stage renal disease: Secondary | ICD-10-CM

## 2016-11-19 ENCOUNTER — Ambulatory Visit (HOSPITAL_COMMUNITY)
Admission: RE | Admit: 2016-11-19 | Discharge: 2016-11-19 | Disposition: A | Discharge status: Home / Self Care | Payer: Medicare (Managed Care) | Source: Ambulatory Visit | Attending: Nephrology | Admitting: Nephrology

## 2016-11-19 ENCOUNTER — Encounter (HOSPITAL_COMMUNITY): Payer: Self-pay | Admitting: Interventional Radiology

## 2016-11-19 ENCOUNTER — Other Ambulatory Visit (HOSPITAL_COMMUNITY): Payer: Self-pay | Admitting: Nephrology

## 2016-11-19 DIAGNOSIS — Y832 Surgical operation with anastomosis, bypass or graft as the cause of abnormal reaction of the patient, or of later complication, without mention of misadventure at the time of the procedure: Secondary | ICD-10-CM | POA: Diagnosis not present

## 2016-11-19 DIAGNOSIS — N186 End stage renal disease: Secondary | ICD-10-CM | POA: Diagnosis not present

## 2016-11-19 DIAGNOSIS — T82858A Stenosis of vascular prosthetic devices, implants and grafts, initial encounter: Secondary | ICD-10-CM | POA: Diagnosis present

## 2016-11-19 HISTORY — PX: IR AV DIALY SHUNT INTRO NEEDLE/INTRACATH INITIAL W/PTA/IMG RIGHT: IMG6116

## 2016-11-19 MED ORDER — IOPAMIDOL (ISOVUE-300) INJECTION 61%
INTRAVENOUS | Status: AC
  Administered 2016-11-19: 20 mL
  Filled 2016-11-19: qty 50

## 2016-11-19 MED ORDER — LIDOCAINE HCL (PF) 1 % IJ SOLN
INTRAMUSCULAR | Status: DC | PRN
  Administered 2016-11-19: 10 mL

## 2016-11-19 MED ORDER — LIDOCAINE HCL (PF) 1 % IJ SOLN
INTRAMUSCULAR | Status: AC
  Filled 2016-11-19: qty 30

## 2016-11-19 MED ORDER — IOPAMIDOL (ISOVUE-300) INJECTION 61%
INTRAVENOUS | Status: AC
  Administered 2016-11-19: 50 mL
  Filled 2016-11-19: qty 100

## 2016-11-19 NOTE — Procedures (Signed)
esrd  S/P 7MM OUTLFOW BRACHIAL VEIN PTA  No comp Stable Ready for use  Full report in PACS

## 2016-12-06 ENCOUNTER — Encounter (HOSPITAL_COMMUNITY): Payer: Self-pay | Admitting: Emergency Medicine

## 2016-12-06 ENCOUNTER — Emergency Department (HOSPITAL_COMMUNITY)
Admission: EM | Admit: 2016-12-06 | Discharge: 2016-12-06 | Discharge status: Against Medical Advice | Payer: Medicare (Managed Care) | Attending: Emergency Medicine | Admitting: Emergency Medicine

## 2016-12-06 ENCOUNTER — Emergency Department (HOSPITAL_COMMUNITY): Payer: Medicare (Managed Care)

## 2016-12-06 DIAGNOSIS — I509 Heart failure, unspecified: Secondary | ICD-10-CM | POA: Insufficient documentation

## 2016-12-06 DIAGNOSIS — I110 Hypertensive heart disease with heart failure: Secondary | ICD-10-CM | POA: Diagnosis not present

## 2016-12-06 DIAGNOSIS — I429 Cardiomyopathy, unspecified: Secondary | ICD-10-CM | POA: Insufficient documentation

## 2016-12-06 DIAGNOSIS — Z79899 Other long term (current) drug therapy: Secondary | ICD-10-CM | POA: Diagnosis not present

## 2016-12-06 DIAGNOSIS — R079 Chest pain, unspecified: Secondary | ICD-10-CM | POA: Diagnosis not present

## 2016-12-06 DIAGNOSIS — E213 Hyperparathyroidism, unspecified: Secondary | ICD-10-CM | POA: Insufficient documentation

## 2016-12-06 DIAGNOSIS — Z7901 Long term (current) use of anticoagulants: Secondary | ICD-10-CM | POA: Diagnosis not present

## 2016-12-06 DIAGNOSIS — E119 Type 2 diabetes mellitus without complications: Secondary | ICD-10-CM | POA: Insufficient documentation

## 2016-12-06 DIAGNOSIS — Z86711 Personal history of pulmonary embolism: Secondary | ICD-10-CM | POA: Diagnosis not present

## 2016-12-06 DIAGNOSIS — Z794 Long term (current) use of insulin: Secondary | ICD-10-CM | POA: Diagnosis not present

## 2016-12-06 DIAGNOSIS — N186 End stage renal disease: Secondary | ICD-10-CM | POA: Insufficient documentation

## 2016-12-06 DIAGNOSIS — I4891 Unspecified atrial fibrillation: Secondary | ICD-10-CM | POA: Diagnosis not present

## 2016-12-06 DIAGNOSIS — Z87891 Personal history of nicotine dependence: Secondary | ICD-10-CM | POA: Diagnosis not present

## 2016-12-06 LAB — CBC
HCT: 36.9 % (ref 36.0–46.0)
Hemoglobin: 11.1 g/dL — ABNORMAL LOW (ref 12.0–15.0)
MCH: 30.8 pg (ref 26.0–34.0)
MCHC: 30.1 g/dL (ref 30.0–36.0)
MCV: 102.5 fL — ABNORMAL HIGH (ref 78.0–100.0)
Platelets: 189 10*3/uL (ref 150–400)
RBC: 3.6 MIL/uL — ABNORMAL LOW (ref 3.87–5.11)
RDW: 16.4 % — ABNORMAL HIGH (ref 11.5–15.5)
WBC: 7 10*3/uL (ref 4.0–10.5)

## 2016-12-06 LAB — BASIC METABOLIC PANEL
Anion gap: 14 (ref 5–15)
BUN: 21 mg/dL — ABNORMAL HIGH (ref 6–20)
CO2: 25 mmol/L (ref 22–32)
Calcium: 9.9 mg/dL (ref 8.9–10.3)
Chloride: 98 mmol/L — ABNORMAL LOW (ref 101–111)
Creatinine, Ser: 4.8 mg/dL — ABNORMAL HIGH (ref 0.44–1.00)
GFR calc Af Amer: 10 mL/min — ABNORMAL LOW (ref >60)
GFR calc non Af Amer: 9 mL/min — ABNORMAL LOW (ref >60)
Glucose, Bld: 106 mg/dL — ABNORMAL HIGH (ref 65–99)
Potassium: 4.1 mmol/L (ref 3.5–5.1)
Sodium: 137 mmol/L (ref 135–145)

## 2016-12-06 LAB — TROPONIN I: Troponin I: 0.05 ng/mL (ref <0.03)

## 2016-12-06 LAB — I-STAT TROPONIN, ED: Troponin i, poc: 0.03 ng/mL (ref 0.00–0.08)

## 2016-12-06 LAB — PROTIME-INR
INR: 2.3
Prothrombin Time: 25.7 seconds — ABNORMAL HIGH (ref 11.4–15.2)

## 2016-12-06 NOTE — ED Triage Notes (Signed)
Pt. Stated, I started having some chest pain last night off and on. Never happened before.

## 2016-12-06 NOTE — ED Notes (Signed)
CRITICAL VALUE ALERT  Critical Value:  Troponin 0.05  Date & Time Notied:  12/06/16 11:00  Provider Notified: Dr. Wilson Singer  Orders Received/Actions taken:

## 2016-12-06 NOTE — ED Notes (Signed)
RN went into patient's room to obtain a signature on the AMA form only to find that the patient had left the ED. Patient discharged without signature.

## 2016-12-06 NOTE — ED Notes (Signed)
Gave pt some crackers and water. Pt stated "If I do not get something to eat I feel like I am going to pass out." Nurse was notified.

## 2016-12-06 NOTE — ED Provider Notes (Signed)
[place] DEPT Provider Note   CSN: [id number] Arrival date & time: 12/06/16  8119     History   Chief Complaint Chief Complaint  Patient presents with  . Chest Pain    HPI Daniele L Glatfelter is a 66 y.o. female.  HPI   66yF with CP. L sided. Intermittent. First noticed around 1900 yesterday while sitting. Episodes last 1-3 minutes. Dull ache which crescendos then subsides completely. Denies associated dyspnea, palpitations, diaphoresis, nausea. No appreciable exacerbating or relieving factors.  Never had pain like this before. No fever or chills. No cough.  Hx of PE, afib and presumed nonischemic cardiomyopathy. On coumadin. Doesn't think she has ever had LHC. No ischemia on stress in 2012.   Past Medical History:  Diagnosis Date  . Anemia    Anemia of chronic disease  . Atrial fibrillation ([place])   . Cardiomyopathy ([place])    presumed non-ischemic due to no ischemia on stress 07/2010 [facility])  . CHF (congestive heart failure) ([place])   . Chronic kidney disease    T TH SAT [address]  . Diabetes mellitus    Type II  . DVT (deep venous thrombosis) ([facility]) approx 1999  . Hypertension   . Necrotizing fasciitis ([place]) 2012  . Pneumonia 2010  . Pulmonary embolus ([facility]) approx 1999  . Shortness of breath   . Thyroid disease    Secondary Hyperparathyroidism    Patient Active Problem List   Diagnosis Date Noted  . Atherosclerosis of native arteries of extremity with intermittent claudication ([place]) 10/23/2014  . DM (diabetes mellitus) ([place]) 09/06/2011  . Chronic a-fib ([place]) 09/06/2011  . 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 CHF (congestive heart failure) ([place]) 09/06/2011  . Hypertension 09/06/2011  . Anemia of chronic disease 09/06/2011  . Hyperparathyroidism, secondary renal ([place]) 09/06/2011  . End stage renal disease ([place]) 06/16/2011  . Other complications due to renal dialysis device, implant, and graft 06/16/2011    Past Surgical History:  Procedure Laterality Date  . AV  FISTULA PLACEMENT  02/10/2011   Left Brachiocephalic Arteriovenous Fistula Formation  . AV FISTULA PLACEMENT  12/10/2011   Procedure: INSERTION OF ARTERIOVENOUS (AV) GORE-TEX GRAFT ARM;  Surgeon: Angelia Mould, MD;  Location: [facility];  Service: Vascular;  Laterality: Right;  . AVF revision  07-23-2011   left  . BACK SURGERY  10-2010   pt. 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 CESAREAN SECTION    . FISTULOGRAM Left 07/05/2011   Procedure: FISTULOGRAM;  Surgeon: Angelia Mould, MD;  Location: [facility] CATH LAB;  Service: Cardiovascular;  Laterality: Left;  . IR AV DIALY SHUNT INTRO NEEDLE/INTRACATH INITIAL W/PTA/IMG RIGHT Right 11/19/2016  . IR GENERIC HISTORICAL Right 12/29/2015   IR AV DIALY SHUNT INTRO NEEDLE/INTRACATH INITIAL W/PTA/IMG RIGHT 12/29/2015 Arne Cleveland, MD [facility]  . IR GENERIC HISTORICAL  06/25/2016   IR US GUIDE VASC ACCESS RIGHT 06/25/2016 Greggory Keen, MD [facility]  . IR GENERIC HISTORICAL Right 06/25/2016   IR AV DIALY SHUNT INTRO NEEDLE/INTRACATH INITIAL W/PTA/IMG RIGHT 06/25/2016 Greggory Keen, MD [facility]  . IR GENERIC HISTORICAL Right 07/12/2016   IR AV DIALY SHUNT INTRO NEEDLE/INTRACATH INITIAL W/PTA/IMG RIGHT 07/12/2016 Sandi Mariscal, MD [facility]  . SALIVARY GLAND SURGERY     stone removed    OB History    No data available       Home Medications    Prior to Admission medications   Medication Sig Start Date End  Date Taking? Authorizing Provider  acetaminophen (TYLENOL) 500 MG tablet Take 500 mg by mouth every 4 (four) hours as needed for moderate pain or headache.     [provider]  calcium carbonate (TUMS - DOSED IN MG ELEMENTAL CALCIUM) 500 MG chewable tablet Chew 1 tablet by mouth 3 (three) times daily before meals. Also before snacks    [provider]  cetirizine (ZYRTEC) 5 MG tablet Take 5 mg by mouth daily.    [provider]  digoxin (LANOXIN) 0.125 MG tablet  Take 125 mcg by mouth 3 (three) times a week. Monday, Wednesday, Friday    [provider]  folic acid-vitamin b complex-vitamin c-selenium-zinc (DIALYVITE) 3 MG TABS Take 1 tablet by mouth daily.    [provider]  glipiZIDE (GLUCOTROL) 10 MG tablet Take 10 mg by mouth 2 (two) times daily before a meal.    [provider]  Hypromellose (NATURES TEARS OP) Apply 1 drop to eye 2 (two) times daily as needed (irritated eyes).     [provider]  Insulin Glargine (LANTUS) 100 UNIT/ML Solostar Pen Inject 40 Units into the skin daily at 10 pm.    [provider]  Lidocaine-Prilocaine, Bulk, 2.5-2.5 % CREA Apply 1 application topically 3 (three) times a week.     [provider]  metoprolol tartrate (LOPRESSOR) 25 MG tablet Take 25 mg by mouth 2 (two) times daily.    [provider]  miconazole (MICOTIN) 2 % powder Apply topically as needed for itching. 1 app applied topically bid prn to abdominal folds    [provider]  simethicone (MYLICON) 80 MG chewable tablet Chew 80 mg by mouth 4 (four) times daily as needed for flatulence.     [provider]  warfarin (COUMADIN) 4 MG tablet Take 4-8 mg by mouth daily at 6 PM. Take 8 mg on Monday, Wed, and Friday. Take 4 mg on all other days.    [provider]    Family History Family History  Problem Relation Age of Onset  . Cancer Mother   . Heart disease Father   . Cancer Sister   . Anesthesia problems Neg Hx   . Hypotension Neg Hx   . Malignant hyperthermia Neg Hx   . Pseudochol deficiency Neg Hx     Social History Social History  Substance Use Topics  . Smoking status: Former Smoker    Years: 20.00    Types: Cigarettes    Quit date: 05/31/2010  . Smokeless tobacco: Never Used  . Alcohol use No     Allergies   Sulfa antibiotics   Review of Systems Review of Systems  All systems reviewed and negative, other than as noted in HPI.   Physical  Exam Updated Vital Signs BP (!) 147/93 (BP Location: Left Arm)   Pulse 81   Resp 17   Ht 5\' 3"  (1.6 m)   Wt 81.6 kg (180 lb)   SpO2 97%   BMI 31.89 kg/m   Physical Exam  Constitutional: She appears well-developed and well-nourished. No distress.  HENT:  Head: Normocephalic and atraumatic.  Eyes: Conjunctivae are normal. Right eye exhibits no discharge. Left eye exhibits no discharge.  Neck: Neck supple.  Cardiovascular: Normal rate, regular rhythm and normal heart sounds.  Exam reveals no gallop and no friction rub.   No murmur heard. Pulmonary/Chest: Effort normal and breath sounds normal. No respiratory distress.  Points just beneath L breast as area of pain. Not reproducible.  Abdominal: Soft. She exhibits no distension. There is no tenderness.  Musculoskeletal: She exhibits edema. She exhibits no tenderness.  Mild/moderate symmetric LE edema  Neurological: She is alert.  Skin: Skin is warm and dry.  Psychiatric: She has a normal mood and affect. Her behavior is normal. Thought content normal.  Nursing note and vitals reviewed.    ED Treatments / Results  Labs (all labs ordered are listed, but only abnormal results are displayed) Labs Reviewed  BASIC METABOLIC PANEL - Abnormal; Notable for the following:       Result Value   Chloride 98 (*)    Glucose, Bld 106 (*)    BUN 21 (*)    Creatinine, Ser 4.80 (*)    GFR calc non Af Amer 9 (*)    GFR calc Af Amer 10 (*)    All other components within normal limits  CBC - Abnormal; Notable for the following:    RBC 3.60 (*)    Hemoglobin 11.1 (*)    MCV 102.5 (*)    RDW 16.4 (*)    All other components within normal limits  PROTIME-INR  TROPONIN I  I-STAT TROPOININ, ED    EKG  EKG Interpretation  Date/Time:  Monday December 06 2016 07:11:40 EDT Ventricular Rate:  100 PR Interval:    QRS Duration: 90 QT Interval:  324 QTC Calculation: 417 R Axis:   142 Text Interpretation:  Atrial fibrillation with premature  ventricular or aberrantly conducted complexes Possible Right ventricular hypertrophy Septal infarct , age undetermined Lateral infarct , age undetermined T wave abnormality, consider inferior ischemia Abnormal ECG Since prior ECG, rate has slowed Confirmed by Gareth Morgan [phone]) on 12/06/2016 7:34:40 AM Also confirmed by Gareth Morgan [id number]), editor Drema Pry [id number])  on 12/06/2016 8:57:16 AM       Radiology Dg Chest 2 View  Result Date: 12/06/2016 CLINICAL DATA:  Chest pain EXAM: CHEST  2 VIEW COMPARISON:  08/01/2015 chest radiograph. FINDINGS: Low lung volumes. Stable cardiomediastinal silhouette with mild cardiomegaly. No pneumothorax. Stable mild blunting of the right costophrenic angle. No left pleural effusion. No overt pulmonary edema. Mild bibasilar scarring versus atelectasis. IMPRESSION: 1. Stable mild cardiomegaly without overt pulmonary edema. 2. Stable mild blunting of the right costophrenic angle, which may represent a small right pleural effusion versus chronic pleuroparenchymal scarring. 3. Mild bibasilar scarring versus atelectasis. Electronically Signed   By: Ilona Sorrel M.D.   On: 12/06/2016 07:39    Procedures Procedures (including critical care time)  Medications Ordered in ED Medications - No data to display   Initial Impression / Assessment and Plan / ED Course  I have reviewed the triage vital signs and the nursing notes.  Pertinent labs & imaging results that were available during my care of the patient were reviewed by me and considered in my medical decision making (see chart for details).     66 year old female with chest pain. Seems atypical for ACS. Intermittent episodes lasting a couple minutes at most. No appreciable exacerbating/relieving factors. Has had at rest. Hasn't noticed any change with exertion. No other associated symptoms. No known CAD.   She is afebrile. No increased work of breathing. Chest x-ray w/o acute findings. Doubt PE  particularly being on warfarin. Check INR.  Will discuss with cardiology. I'm not convinced this is cardiac, but troponin is mildly elevated and I don't have a clear alternative explanation at this point.   1:36 PM Pt/daughter unhappy with wait and want to leave. They expressed these  same concerns when I checked on them/updated them previous. Their concerns again acknowledged but that I could not give them a definitive time when they may be evaluated. They left the ED prior to actually being discharged or me speaking with them again in terms of follow-up recommendations.   Final Clinical Impressions(s) / ED Diagnoses   Final diagnoses:  Chest pain, unspecified type    New Prescriptions New Prescriptions   No medications on file     Virgel Manifold, MD 12/06/16 1348

## 2016-12-06 NOTE — ED Notes (Signed)
Call [facility] [phone] - if you have any issues regarding her medications .

## 2016-12-06 NOTE — ED Notes (Signed)
Patient states that she wants to leave without being seen by cardiology.  Dr Wilson Singer was notified

## 2016-12-09 ENCOUNTER — Telehealth: Payer: Self-pay | Admitting: Physician Assistant

## 2016-12-09 NOTE — Telephone Encounter (Signed)
Received records from [facility] for appointment on 12/20/16 with Almyra Deforest, PA.  Records put with Hao's schedule for 12/20/16. lp

## 2016-12-20 ENCOUNTER — Encounter: Payer: Self-pay | Admitting: Physician Assistant

## 2016-12-20 ENCOUNTER — Ambulatory Visit (INDEPENDENT_AMBULATORY_CARE_PROVIDER_SITE_OTHER): Payer: Medicare (Managed Care) | Admitting: Physician Assistant

## 2016-12-20 VITALS — BP 128/62 | HR 88 | Ht 63.0 in | Wt 182.8 lb

## 2016-12-20 DIAGNOSIS — I482 Chronic atrial fibrillation, unspecified: Secondary | ICD-10-CM

## 2016-12-20 DIAGNOSIS — N186 End stage renal disease: Secondary | ICD-10-CM | POA: Diagnosis not present

## 2016-12-20 DIAGNOSIS — R7989 Other specified abnormal findings of blood chemistry: Principal | ICD-10-CM

## 2016-12-20 DIAGNOSIS — E118 Type 2 diabetes mellitus with unspecified complications: Secondary | ICD-10-CM

## 2016-12-20 DIAGNOSIS — I739 Peripheral vascular disease, unspecified: Secondary | ICD-10-CM

## 2016-12-20 DIAGNOSIS — I428 Other cardiomyopathies: Secondary | ICD-10-CM | POA: Diagnosis not present

## 2016-12-20 DIAGNOSIS — R748 Abnormal levels of other serum enzymes: Secondary | ICD-10-CM

## 2016-12-20 DIAGNOSIS — I10 Essential (primary) hypertension: Secondary | ICD-10-CM

## 2016-12-20 DIAGNOSIS — R011 Cardiac murmur, unspecified: Secondary | ICD-10-CM

## 2016-12-20 DIAGNOSIS — R778 Other specified abnormalities of plasma proteins: Secondary | ICD-10-CM

## 2016-12-20 NOTE — Progress Notes (Signed)
Cardiology Office Note    Date:  12/21/2016   ID:  Kelly Gallegos, DOB 1950-11-24, MRN [id number]  PCP:  Janifer Adie, MD  Cardiologist:  New - Case discussed with DOD Dr. Martinique  Chief Complaint  Patient presents with  . Follow-up    CAD screening, elevated troponin    History of Present Illness:  Kelly Gallegos is a 66 y.o. female with PMH of HTN, DM II, ESRD on HD TTS, NICM with no ischemia on myoview in 07/2010 [facility], atrial fibrillation, h/o DVT/PE, moderate severe PAD, h/o necrotizing fasciitis and h/o secondary hyperparathyroidism. According to records sent over by primary care physician, her end-stage renal disease was caused by an episode necrotizing fasciitis involving the mid to lower back in 5809 that was complicated by sepsis, cardiomyopathy and renal failure. Other medical diagnosis include proliferative diabetic retinopathy, severe idiopathic cardiomyopathy with congestive heart failure and chronic atrial fibrillation. She did recently presented to the hospital with chest pain on 12/06/2016, unfortunately, before cardiology service can see the patient, they left the ED after a prolonged wait time.  She is accompanied by her daughter today, she is quite pleasant. When asked about her PE, she says in her younger days, she used to sew clothing for a living and sometimes can be quite sedentary for a long period of time. That is likely what caused her initial episode of PE which occurred more than 10 years ago. She has been in chronic atrial fibrillation since 2012, she says she was seen by a cardiologist back then, however has not been seen by cardiology service as outpatient. Her primary care provider manages her medication and also check her Coumadin. Her INR level has been very well-controlled. Because of significant hypotension on her dialysis days, she is not on any rate control medication other than digoxin 3 times a week on nondialysis days. She also takes Midodrin for  her blood pressure. She denies any significant episode of chest pain before. Since the episode of chest pain that brought her to the hospital on 12/06/2016, she has not had any further chest pain in the last 2 weeks. We discussed the various options, I recommended obtaining an echocardiogram as initial workup. If ejection fraction is low, then I would recommend a stress test. Unfortunately, we do not have a lot information regarding her history of cardiomyopathy. Her dialysis related hypotension does not allow me to initiate any heart failure medications. Her recent elevated troponin was flat, suggesting possible trop leak in the setting of significant renal insufficiency.    Past Medical History:  Diagnosis Date  . Anemia    Anemia of chronic disease  . Atrial fibrillation ([place])   . Cardiomyopathy ([place])    presumed non-ischemic due to no ischemia on stress 07/2010 [facility])  . CHF (congestive heart failure) ([place])   . Chronic kidney disease    T TH SAT [address]  . Diabetes mellitus    Type II  . DVT (deep venous thrombosis) (HCC) approx 1999  . Hypertension   . Necrotizing fasciitis ([place]) 2012  . Pneumonia 2010  . Pulmonary embolus (HCC) approx 1999  . Shortness of breath   . Thyroid disease    Secondary Hyperparathyroidism    Past Surgical History:  Procedure Laterality Date  . AV FISTULA PLACEMENT  02/10/2011   Left Brachiocephalic Arteriovenous Fistula Formation  . AV FISTULA PLACEMENT  12/10/2011   Procedure: INSERTION OF ARTERIOVENOUS (AV) GORE-TEX GRAFT ARM;  Surgeon: Angelia Mould,  MD;  Location: [place];  Service: Vascular;  Laterality: Right;  . AVF revision  07-23-2011   left  . BACK SURGERY  10-2010   pt. 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 CESAREAN SECTION    . FISTULOGRAM Left 07/05/2011   Procedure: FISTULOGRAM;  Surgeon: Angelia Mould, MD;  Location: [facility] CATH LAB;  Service: Cardiovascular;  Laterality: Left;  . IR  AV DIALY SHUNT INTRO NEEDLE/INTRACATH INITIAL W/PTA/IMG RIGHT Right 11/19/2016  . IR GENERIC HISTORICAL Right 12/29/2015   IR AV DIALY SHUNT INTRO NEEDLE/INTRACATH INITIAL W/PTA/IMG RIGHT 12/29/2015 Arne Cleveland, MD [facility]INTER[facility] RAD  . IR GENERIC HISTORICAL  06/25/2016   IR US GUIDE VASC ACCESS RIGHT 06/25/2016 Greggory Keen, MD [facility]  . IR GENERIC HISTORICAL Right 06/25/2016   IR AV DIALY SHUNT INTRO NEEDLE/INTRACATH INITIAL W/PTA/IMG RIGHT 06/25/2016 Greggory Keen, MD [facility]  . IR GENERIC HISTORICAL Right 07/12/2016   IR AV DIALY SHUNT INTRO NEEDLE/INTRACATH INITIAL W/PTA/IMG RIGHT 07/12/2016 Sandi Mariscal, MD [facility]INTER[facility]  . SALIVARY GLAND SURGERY     stone removed    Current Medications: Outpatient Medications Prior to Visit  Medication Sig Dispense Refill  . acetaminophen (TYLENOL) 500 MG tablet Take 500 mg by mouth every 4 (four) hours as needed for moderate pain or headache.     . calcium carbonate (TUMS - DOSED IN MG ELEMENTAL CALCIUM) 500 MG chewable tablet Chew 1 tablet by mouth 3 (three) times daily before meals. Also before snacks    . cetirizine (ZYRTEC) 5 MG tablet Take 5 mg by mouth daily.    . cholestyramine (QUESTRAN) 4 g packet Take 4 g by mouth daily as needed.    . digoxin (LANOXIN) 0.125 MG tablet Take 125 mcg by mouth 3 (three) times a week. Monday, Wednesday, Friday    . folic acid-vitamin b complex-vitamin c-selenium-zinc (DIALYVITE) 3 MG TABS Take 1 tablet by mouth daily.    Marland Kitchen glipiZIDE (GLUCOTROL) 10 MG tablet Take 10 mg by mouth 2 (two) times daily before a meal.    . Hypromellose (NATURES TEARS OP) Apply 1 drop to eye 2 (two) times daily as needed (irritated eyes).     . Lidocaine-Prilocaine, Bulk, 2.5-2.5 % CREA Apply 1 application topically 3 (three) times a week.     . loperamide (IMODIUM A-D) 2 MG tablet Take 2 mg by mouth every 4 (four) hours as needed for diarrhea or loose stools.    . miconazole (MICOTIN) 2 % powder Apply 1 application topically as  needed for itching.    . midodrine (PROAMATINE) 10 MG tablet Take 10 mg by mouth 2 (two) times daily.    . simethicone (MYLICON) 297 MG chewable tablet Chew 125 mg by mouth every 6 (six) hours as needed for flatulence.    . Skin Protectants, Misc. (EUCERIN) cream Apply topically as needed for dry skin.    Marland Kitchen warfarin (COUMADIN) 3 MG tablet Take 1.5-3 mg by mouth See admin instructions. Pt takes 1.5mg  on M, W, F - takes 3mg  on Tu, Thu, Sa, Su     No facility-administered medications prior to visit.      Allergies:   Sulfa antibiotics and Oxycodone   Social History   Social History  . Marital status: Legally Separated    Spouse name: N/A  . Number of children: N/A  . Years of education: N/A   Social History Main Topics  . Smoking status: Former Smoker    Years:  20.00    Types: Cigarettes    Quit date: 05/31/2010  . Smokeless tobacco: Never Used  . Alcohol use No  . Drug use: No  . Sexual activity: Not Currently    Birth control/ protection: Post-menopausal   Other Topics Concern  . None   Social History Narrative  . None     Family History:  The patient's family history includes Cancer in her sister; Cancer (age of onset: 103) in her mother; Heart disease (age of onset: 57) in her father.   ROS:   Please see the history of present illness.    ROS All other systems reviewed and are negative.   PHYSICAL EXAM:   VS:  BP 128/62   Pulse 88   Ht 5\' 3"  (1.6 m)   Wt 182 lb 12.8 oz (82.9 kg)   BMI 32.38 kg/m    GEN: Well nourished, well developed, in no acute distress  HEENT: normal  Neck: no JVD, carotid bruits, or masses Cardiac: RRR; no rubs, or gallops,no edema  8-1+ systolic murmur at mitral area Respiratory:  clear to auscultation bilaterally, normal work of breathing GI: soft, nontender, nondistended, + BS MS: no deformity or atrophy  Skin: warm and dry, no rash Neuro:  Alert and Oriented x 3, Strength and sensation are intact Psych: euthymic mood, full  affect  Wt Readings from Last 3 Encounters:  12/20/16 182 lb 12.8 oz (82.9 kg)  12/06/16 180 lb (81.6 kg)  08/01/15 191 lb 14.4 oz (87 kg)      Studies/Labs Reviewed:   EKG:  EKG is not ordered today.    Recent Labs: 12/06/2016: BUN 21; Creatinine, Ser 4.80; Hemoglobin 11.1; Platelets 189; Potassium 4.1; Sodium 137   Lipid Panel No results found for: CHOL, TRIG, HDL, CHOLHDL, VLDL, LDLCALC, LDLDIRECT  Additional studies/ records that were reviewed today include:   Recent ED record, outside records from primary care provider   LE ABI 07/2015   ASSESSMENT:    1. Elevated troponin   2. Heart murmur   3. NICM (nonischemic cardiomyopathy) ([place])   4. ESRD (end stage renal disease) ([place])   5. Essential hypertension   6. Controlled type 2 diabetes mellitus with complication, without long-term current use of insulin ([place])   7. Chronic atrial fibrillation (HCC)   8. PAD (peripheral artery disease) (HCC)      PLAN:  In order of problems listed above:  1. Elevated troponin: In the setting of ESRD, trend of troponin inconsistent with MI. I will hold off on stress testing at this time.  2. Heart murmur: She has a murmur near the mitral valve area, we will order echocardiogram.  3. NICM: She carries a diagnosis of nonischemic myopathy, however I am unable to locate the prior record regarding ejection fraction. If EF is indeed low, she will need a stress test.  4. Chronic atrial fibrillation: Coumadin managed by primary care provider. Due to significant hypotension on dialysis days, he is currently not on any AV nodal controlling agent other than digoxin 3 times a week on nondialysis days.  5. PAD: Followed by Dr. Bridgett Larsson, last ABI in March 2017  6. Hypertension: Blood pressure stable however according to patient, her blood pressure tended to drop significantly on dialysis days.  7. DM 2: Managed by primary care provider.    Medication Adjustments/Labs and Tests Ordered: Current  medicines are reviewed at length with the patient today.  Concerns regarding medicines are outlined above.  Medication changes, Labs and  Tests ordered today are listed in the Patient Instructions below. Patient Instructions  Medication Instructions:   No change  Labwork:   None  Testing/Procedures:  Your physician has requested that you have an echocardiogram. Echocardiography is a painless test that uses sound waves to create images of your heart. It provides your doctor with information about the size and shape of your heart and how well your heart's chambers and valves are working. This procedure takes approximately one hour. There are no restrictions for this procedure.   Follow-Up:  3 months with Dr. Martinique   If you need a refill on your cardiac medications before your next appointment, please call your pharmacy.      Hilbert Corrigan, [place]  12/21/2016 9:54 PM    [facility] [address], [place], [place]  [address] Phone: [phone]; Fax: [phone]

## 2016-12-20 NOTE — Patient Instructions (Addendum)
Medication Instructions:   No change  Labwork:   None  Testing/Procedures:  Your physician has requested that you have an echocardiogram. Echocardiography is a painless test that uses sound waves to create images of your heart. It provides your doctor with information about the size and shape of your heart and how well your heart's chambers and valves are working. This procedure takes approximately one hour. There are no restrictions for this procedure.   Follow-Up:  3 months with Dr. Martinique   If you need a refill on your cardiac medications before your next appointment, please call your pharmacy.

## 2016-12-21 ENCOUNTER — Encounter: Payer: Self-pay | Admitting: Physician Assistant

## 2016-12-27 ENCOUNTER — Telehealth (HOSPITAL_COMMUNITY): Payer: Self-pay | Admitting: Physician Assistant

## 2016-12-28 ENCOUNTER — Telehealth: Payer: Self-pay | Admitting: Physician Assistant

## 2016-12-28 NOTE — Telephone Encounter (Signed)
Called patient and left a VM to call back to schedule her echocardiogram.

## 2016-12-29 NOTE — Telephone Encounter (Signed)
User: [name]: 12/27/16 1:03 PM  Comment: Called pt and lmsg for him to CB to sch for his echo.   Context: [place] Schedule Orders/Appt Requests Outcome: Left Message  Phone number: [phone] Phone Type: Home Phone  Comm. type: Telephone Call type: Outgoing  Contact: Gallegos, Kelly L Relation to patient: Self    User: [name]: 12/24/16 10:00 AM  Comment: Called pt and lmsg for her to CB to get scheduled for echo.  Context: [place] Schedule Orders/Appt Requests Outcome: Left Message  Phone number: [phone] Phone Type: Home Phone  Comm. type: Telephone Call type: Outgoing  Contact: Gallegos, Kelly L Relation to patient: Self

## 2017-01-03 ENCOUNTER — Ambulatory Visit (HOSPITAL_COMMUNITY): Payer: Medicare (Managed Care)

## 2017-01-12 ENCOUNTER — Ambulatory Visit (HOSPITAL_COMMUNITY): Payer: Medicare (Managed Care) | Attending: Cardiovascular Disease

## 2017-01-12 ENCOUNTER — Other Ambulatory Visit: Payer: Self-pay

## 2017-01-12 DIAGNOSIS — I429 Cardiomyopathy, unspecified: Secondary | ICD-10-CM | POA: Insufficient documentation

## 2017-01-12 DIAGNOSIS — R778 Other specified abnormalities of plasma proteins: Secondary | ICD-10-CM

## 2017-01-12 DIAGNOSIS — I4891 Unspecified atrial fibrillation: Secondary | ICD-10-CM | POA: Diagnosis not present

## 2017-01-12 DIAGNOSIS — I509 Heart failure, unspecified: Secondary | ICD-10-CM | POA: Insufficient documentation

## 2017-01-12 DIAGNOSIS — I130 Hypertensive heart and chronic kidney disease with heart failure and stage 1 through stage 4 chronic kidney disease, or unspecified chronic kidney disease: Secondary | ICD-10-CM | POA: Insufficient documentation

## 2017-01-12 DIAGNOSIS — Z87891 Personal history of nicotine dependence: Secondary | ICD-10-CM | POA: Diagnosis not present

## 2017-01-12 DIAGNOSIS — N189 Chronic kidney disease, unspecified: Secondary | ICD-10-CM | POA: Diagnosis not present

## 2017-01-12 DIAGNOSIS — I071 Rheumatic tricuspid insufficiency: Secondary | ICD-10-CM | POA: Insufficient documentation

## 2017-01-12 DIAGNOSIS — Z8249 Family history of ischemic heart disease and other diseases of the circulatory system: Secondary | ICD-10-CM | POA: Diagnosis not present

## 2017-01-12 DIAGNOSIS — E1122 Type 2 diabetes mellitus with diabetic chronic kidney disease: Secondary | ICD-10-CM | POA: Diagnosis not present

## 2017-01-12 DIAGNOSIS — R7989 Other specified abnormal findings of blood chemistry: Secondary | ICD-10-CM

## 2017-01-12 DIAGNOSIS — R748 Abnormal levels of other serum enzymes: Secondary | ICD-10-CM | POA: Diagnosis present

## 2017-01-12 DIAGNOSIS — R011 Cardiac murmur, unspecified: Secondary | ICD-10-CM | POA: Diagnosis present

## 2017-01-18 ENCOUNTER — Telehealth: Payer: Self-pay | Admitting: *Deleted

## 2017-01-18 DIAGNOSIS — R9430 Abnormal result of cardiovascular function study, unspecified: Secondary | ICD-10-CM

## 2017-01-18 DIAGNOSIS — I429 Cardiomyopathy, unspecified: Secondary | ICD-10-CM

## 2017-01-18 DIAGNOSIS — R079 Chest pain, unspecified: Secondary | ICD-10-CM

## 2017-01-18 NOTE — Telephone Encounter (Signed)
Per discussion w Isaac Laud, pt will need lexiscan stress test.   I left msg for patient last Friday to call and have left a msg this morning as well.

## 2017-01-18 NOTE — Telephone Encounter (Signed)
-----   Message from [place], [place] sent at 01/13/2017  7:52 AM EDT ----- Preliminary echo showed low pumping function, although patient has h/o nonischemic cardiomyopathy, no prior echo to compare to. As mentioned during previous office visit, if EF low, she will need a lexiscan stress test.

## 2017-01-19 NOTE — Telephone Encounter (Signed)
Left additional msg to call.

## 2017-01-20 NOTE — Telephone Encounter (Signed)
Left additional message for daughter to call (listed as contact/DPR).

## 2017-01-26 ENCOUNTER — Other Ambulatory Visit: Payer: Self-pay | Admitting: *Deleted

## 2017-01-26 ENCOUNTER — Encounter: Payer: Self-pay | Admitting: *Deleted

## 2017-01-26 NOTE — Telephone Encounter (Signed)
Letter has been sent to patient w request to call office to set up test. lexiscan stress test ordered.

## 2017-02-16 ENCOUNTER — Telehealth (HOSPITAL_COMMUNITY): Payer: Self-pay

## 2017-02-16 NOTE — Telephone Encounter (Signed)
Encounter complete. 

## 2017-02-18 ENCOUNTER — Ambulatory Visit (HOSPITAL_COMMUNITY)
Admission: RE | Admit: 2017-02-18 | Discharge: 2017-02-18 | Disposition: A | Discharge status: Home / Self Care | Payer: Medicare (Managed Care) | Source: Ambulatory Visit | Attending: Cardiovascular Disease | Admitting: Cardiovascular Disease

## 2017-02-18 DIAGNOSIS — R079 Chest pain, unspecified: Secondary | ICD-10-CM | POA: Diagnosis not present

## 2017-02-18 LAB — MYOCARDIAL PERFUSION IMAGING
CHL CUP NUCLEAR SDS: 1
NUC STRESS TID: 1.21
Peak HR: 99 {beats}/min
Rest HR: 92 {beats}/min
SRS: 1
SSS: 2

## 2017-02-18 MED ORDER — TECHNETIUM TC 99M TETROFOSMIN IV KIT
29.2000 | PACK | Freq: Once | INTRAVENOUS | Status: AC | PRN
  Administered 2017-02-18: 29.2 via INTRAVENOUS
  Filled 2017-02-18: qty 30

## 2017-02-18 MED ORDER — REGADENOSON 0.4 MG/5ML IV SOLN
0.4000 mg | Freq: Once | INTRAVENOUS | Status: AC
  Administered 2017-02-18: 0.4 mg via INTRAVENOUS

## 2017-02-18 MED ORDER — TECHNETIUM TC 99M TETROFOSMIN IV KIT
9.9000 | PACK | Freq: Once | INTRAVENOUS | Status: AC | PRN
  Administered 2017-02-18: 9.9 via INTRAVENOUS
  Filled 2017-02-18: qty 10

## 2017-02-18 NOTE — Telephone Encounter (Signed)
No significant blockage seen. Overall low risk study

## 2017-03-10 ENCOUNTER — Other Ambulatory Visit (HOSPITAL_COMMUNITY): Payer: Self-pay | Admitting: Nephrology

## 2017-03-10 DIAGNOSIS — N186 End stage renal disease: Secondary | ICD-10-CM

## 2017-03-11 ENCOUNTER — Other Ambulatory Visit: Payer: Self-pay | Admitting: General Surgery

## 2017-03-11 ENCOUNTER — Other Ambulatory Visit: Payer: Self-pay | Admitting: Physician Assistant

## 2017-03-14 ENCOUNTER — Other Ambulatory Visit (HOSPITAL_COMMUNITY): Payer: Self-pay | Admitting: Nephrology

## 2017-03-14 ENCOUNTER — Ambulatory Visit (HOSPITAL_COMMUNITY)
Admission: RE | Admit: 2017-03-14 | Discharge: 2017-03-14 | Disposition: A | Discharge status: Home / Self Care | Payer: Medicare (Managed Care) | Source: Ambulatory Visit | Attending: Nephrology | Admitting: Nephrology

## 2017-03-14 ENCOUNTER — Encounter (HOSPITAL_COMMUNITY): Payer: Self-pay | Admitting: Interventional Radiology

## 2017-03-14 DIAGNOSIS — Z992 Dependence on renal dialysis: Secondary | ICD-10-CM | POA: Diagnosis not present

## 2017-03-14 DIAGNOSIS — T82858A Stenosis of vascular prosthetic devices, implants and grafts, initial encounter: Secondary | ICD-10-CM | POA: Diagnosis present

## 2017-03-14 DIAGNOSIS — N186 End stage renal disease: Secondary | ICD-10-CM | POA: Diagnosis not present

## 2017-03-14 DIAGNOSIS — Y832 Surgical operation with anastomosis, bypass or graft as the cause of abnormal reaction of the patient, or of later complication, without mention of misadventure at the time of the procedure: Secondary | ICD-10-CM | POA: Diagnosis not present

## 2017-03-14 HISTORY — PX: IR AV DIALY SHUNT INTRO NEEDLE/INTRACATH INITIAL W/PTA/IMG RIGHT: IMG6116

## 2017-03-14 MED ORDER — IOPAMIDOL (ISOVUE-300) INJECTION 61%
INTRAVENOUS | Status: AC
  Administered 2017-03-14: 60 mL
  Filled 2017-03-14: qty 100

## 2017-03-14 MED ORDER — LIDOCAINE HCL (PF) 1 % IJ SOLN
INTRAMUSCULAR | Status: DC | PRN
  Administered 2017-03-14: 5 mL

## 2017-03-14 MED ORDER — LIDOCAINE HCL 1 % IJ SOLN
INTRAMUSCULAR | Status: AC
  Filled 2017-03-14: qty 20

## 2017-03-14 MED ORDER — IOPAMIDOL (ISOVUE-300) INJECTION 61%
INTRAVENOUS | Status: AC
Start: 2017-03-14 — End: 2017-03-14
  Administered 2017-03-14: 35 mL
  Filled 2017-03-14: qty 50

## 2017-03-14 NOTE — Procedures (Signed)
PTA venous 6/7 mm Success EBL 0 Comp 0

## 2017-03-19 NOTE — Progress Notes (Signed)
Cardiology Office Note    Date:  03/25/2017   ID:  Kelly Gallegos, DOB [birth year], MRN [id number]  PCP:  Janifer Adie, MD  Cardiologist:   Dr. Peter Martinique  Chief Complaint  Patient presents with  . Follow-up    3 months;    History of Present Illness:  Kelly Gallegos is a 66 y.o. female with PMH of HTN, DM II, ESRD on HD TTS, NICM with no ischemia on myoview in 07/2010 [facility], atrial fibrillation, h/o DVT/PE, moderate severe PAD, h/o necrotizing fasciitis and h/o secondary hyperparathyroidism.  Other medical diagnosis include proliferative diabetic retinopathy,  idiopathic cardiomyopathy with congestive heart failure and chronic atrial fibrillation. She did recently presented to the hospital with chest pain on 12/06/2016 but before cardiology service could see the patient they left the ED after a prolonged wait time.   She has been in chronic atrial fibrillation since 2012, she says she was seen by a cardiologist back then. Results are unknown. Her primary care provider manages her medication and also check her Coumadin. Her INR level has been  well-controlled. Because of significant hypotension on her dialysis days, she is not on any rate control medication other than digoxin 3 times a week on nondialysis days. She also takes Midodrin for her blood pressure.   Since her last visit she was evaluated with an Echo that showed EF 30-35%, Mild AS. Dilated RV and RA with moderate TR and estimated pulmonary pressure of 33 mm Hg. Myocardial perfusion was normal based on Myoview study.  On follow up today she states she is doing well. Denies any further chest pain or SOB. She still has edema but reports she is at her dry weight. Last A1c 5.1% and diabetes medication reduced. No palpitations or syncope.     Past Medical History:  Diagnosis Date  . Anemia    Anemia of chronic disease  . Atrial fibrillation ([place])   . Cardiomyopathy ([place])    presumed non-ischemic due to no ischemia on  stress 07/2010 [facility])  . CHF (congestive heart failure) ([place])   . Chronic kidney disease    T TH SAT [address]  . Diabetes mellitus    Type II  . DVT (deep venous thrombosis) (HCC) approx 1999  . Hypertension   . Necrotizing fasciitis ([place]) 2012  . Pneumonia 2010  . Pulmonary embolus (HCC) approx 1999  . Shortness of breath   . Thyroid disease    Secondary Hyperparathyroidism    Past Surgical History:  Procedure Laterality Date  . AV FISTULA PLACEMENT  02/10/2011   Left Brachiocephalic Arteriovenous Fistula Formation  . AV FISTULA PLACEMENT  12/10/2011   Procedure: INSERTION OF ARTERIOVENOUS (AV) GORE-TEX GRAFT ARM;  Surgeon: Angelia Mould, MD;  Location: [place];  Service: Vascular;  Laterality: Right;  . AVF revision  07-23-2011   left  . BACK SURGERY  10-2010   pt. 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 CESAREAN SECTION    . FISTULOGRAM Left 07/05/2011   Procedure: FISTULOGRAM;  Surgeon: Angelia Mould, MD;  Location: [facility] CATH LAB;  Service: Cardiovascular;  Laterality: Left;  . IR AV DIALY SHUNT INTRO NEEDLE/INTRACATH INITIAL W/PTA/IMG RIGHT Right 11/19/2016  . IR AV DIALY SHUNT INTRO NEEDLE/INTRACATH INITIAL W/PTA/IMG RIGHT Right 03/14/2017  . IR GENERIC HISTORICAL Right 12/29/2015   IR AV DIALY SHUNT INTRO NEEDLE/INTRACATH INITIAL W/PTA/IMG RIGHT 12/29/2015 Arne Cleveland, MD [facility]  . IR GENERIC HISTORICAL  06/25/2016   IR US GUIDE VASC ACCESS RIGHT 06/25/2016 Greggory Keen, MD [facility]  . IR GENERIC HISTORICAL Right 06/25/2016   IR AV DIALY SHUNT INTRO NEEDLE/INTRACATH INITIAL W/PTA/IMG RIGHT 06/25/2016 Greggory Keen, MD [facility]  . IR GENERIC HISTORICAL Right 07/12/2016   IR AV DIALY SHUNT INTRO NEEDLE/INTRACATH INITIAL W/PTA/IMG RIGHT 07/12/2016 Sandi Mariscal, MD [facility]  . SALIVARY GLAND SURGERY     stone removed    Current Medications: Outpatient Medications Prior to Visit  Medication Sig Dispense  Refill  . acetaminophen (TYLENOL) 500 MG tablet Take 500 mg by mouth every 4 (four) hours as needed for moderate pain or headache.     . calcium carbonate (TUMS - DOSED IN MG ELEMENTAL CALCIUM) 500 MG chewable tablet Chew 1 tablet by mouth 3 (three) times daily before meals. Also before snacks    . cetirizine (ZYRTEC) 5 MG tablet Take 5 mg by mouth daily.    . cholestyramine (QUESTRAN) 4 g packet Take 4 g by mouth daily as needed.    . digoxin (LANOXIN) 0.125 MG tablet Take 125 mcg by mouth 3 (three) times a week. Monday, Wednesday, Friday    . folic acid-vitamin b complex-vitamin c-selenium-zinc (DIALYVITE) 3 MG TABS Take 1 tablet by mouth daily.    . Hypromellose (NATURES TEARS OP) Apply 1 drop to eye 2 (two) times daily as needed (irritated eyes).     . Lidocaine-Prilocaine, Bulk, 2.5-2.5 % CREA Apply 1 application topically 3 (three) times a week.     . loperamide (IMODIUM A-D) 2 MG tablet Take 2 mg by mouth every 4 (four) hours as needed for diarrhea or loose stools.    . miconazole (MICOTIN) 2 % powder Apply 1 application topically as needed for itching.    . midodrine (PROAMATINE) 10 MG tablet Take 10 mg by mouth 2 (two) times daily.    . simethicone (MYLICON) 458 MG chewable tablet Chew 125 mg by mouth every 6 (six) hours as needed for flatulence.    . Skin Protectants, Misc. (EUCERIN) cream Apply topically as needed for dry skin.    Marland Kitchen warfarin (COUMADIN) 3 MG tablet Take 1.5-3 mg by mouth See admin instructions. Pt takes 1.5mg  on M, W, F - takes 3mg  on Tu, Thu, Sa, Su    . glipiZIDE (GLUCOTROL) 10 MG tablet Take 10 mg by mouth 2 (two) times daily before a meal.     No facility-administered medications prior to visit.      Allergies:   Sulfa antibiotics and Oxycodone   Social History   Social History  . Marital status: Legally Separated    Spouse name: N/A  . Number of children: N/A  . Years of education: N/A   Social History Main Topics  . Smoking status: Former Smoker     Years: 20.00    Types: Cigarettes    Quit date: 05/31/2010  . Smokeless tobacco: Never Used  . Alcohol use No  . Drug use: No  . Sexual activity: Not Currently    Birth control/ protection: Post-menopausal   Other Topics Concern  . None   Social History Narrative  . None     Family History:  The patient's family history includes Cancer in her sister; Cancer (age of onset: 88) in her mother; Heart disease (age of onset: 38) in her father.   ROS:   Please see the history of present illness.    ROS All other systems reviewed and are negative.   PHYSICAL EXAM:  VS:  BP (!) 141/74   Pulse (!) 103   Ht 5\' 3"  (1.6 m)   Wt 177 lb 3.2 oz (80.4 kg)   BMI 31.39 kg/m    GENERAL:  [place] WF seen in wheelchair.  HEENT:  PERRL, EOMI, sclera are clear. Oropharynx is clear. NECK:  No jugular venous distention, carotid upstroke brisk and symmetric, no bruits, no thyromegaly or adenopathy LUNGS:  Clear to auscultation bilaterally CHEST:  Unremarkable HEART:  RRR,  PMI not displaced or sustained,S1 and S2 within normal limits, no S3, no S4: gr 2/6 systolic murmur at apex. Soft 9-7/9 systolic murmur RUSB.  ABD:  Soft, nontender. BS +, no masses or bruits. No hepatomegaly, no splenomegaly EXT:  2 + pulses throughout, 2+ pitting  Edema, functioning AV fistula right forearm SKIN:  Warm and dry.  No rashes NEURO:  Alert and oriented x 3. Cranial nerves II through XII intact. PSYCH:  Cognitively intact    Wt Readings from Last 3 Encounters:  03/25/17 177 lb 3.2 oz (80.4 kg)  02/18/17 182 lb (82.6 kg)  12/20/16 182 lb 12.8 oz (82.9 kg)      Studies/Labs Reviewed:   EKG:  EKG is not ordered today.    Recent Labs: 12/06/2016: BUN 21; Creatinine, Ser 4.80; Hemoglobin 11.1; Platelets 189; Potassium 4.1; Sodium 137   Lipid Panel No results found for: CHOL, TRIG, HDL, CHOLHDL, VLDL, LDLCALC, LDLDIRECT  Additional studies/ records that were reviewed today include:   LE ABI  07/2015   Echo 01/12/17: Study Conclusions  - Left ventricle: The cavity size was normal. Systolic function was   moderately to severely reduced. The estimated ejection fraction   was in the range of 30% to 35%. Dyskinesis of the   basal-midanteroseptal myocardium. - Aortic valve: Moderately calcified annulus. Moderately thickened,   moderately calcified leaflets. There was mild stenosis. Valve   area (Vmax): 1.53 cm^2. - Right ventricle: The cavity size was moderately dilated. Systolic   function was moderately reduced. - Right atrium: The atrium was moderately dilated. - Tricuspid valve: There was moderate regurgitation. - Pulmonary arteries: Systolic pressure was mildly increased. PA   peak pressure: 33 mm Hg (S).  Myoview 02/18/17: Study Highlights     There was no ST segment deviation noted during stress.  The study is normal.  This is a low risk study.   Normal perfusion  No gating / EF due to afib Borderline TID 1.21  RV appears dilated     ASSESSMENT:    1. Chronic atrial fibrillation ([place])   2. NICM (nonischemic cardiomyopathy) ([place])   3. Essential hypertension   4. ESRD (end stage renal disease) ([place])      PLAN:  In order of problems listed above:  1. Heart murmur secondary to mild AS and moderate TR.   2. NICM: EF 30-35%- chronicity is unknown but she carries a diagnosis of this so I suspect it is chronic. No ischemia on myoview. Medical therapy limited by ESRD and hypotension with dialysis. Will continue digoxin only. Volume status managed by dialysis. Poor candidate for ICD due to comorbidities and limited prognosis.   3. Chronic atrial fibrillation: Coumadin managed by primary care provider. Rate controlled on no AV nodal agents except digoxin.  4. PAD: Followed by Dr. Bridgett Larsson, last ABI in March 2017  5. Hypertension: Blood pressure stable today  6. DM 2: Managed by primary care provider.    Medication Adjustments/Labs and Tests Ordered: Current  medicines are reviewed at length  with the patient today.  Concerns regarding medicines are outlined above.  Medication changes, Labs and Tests ordered today are listed in the Patient Instructions below. Patient Instructions  Continue your current therapy  I will see you in in 6 months.      Signed, Peter Martinique, MD  03/25/2017 10:12 AM    [facility], [place], [place]  [address] Phone: [phone]; Fax: [phone]

## 2017-03-25 ENCOUNTER — Encounter: Payer: Self-pay | Admitting: Cardiology

## 2017-03-25 ENCOUNTER — Ambulatory Visit (INDEPENDENT_AMBULATORY_CARE_PROVIDER_SITE_OTHER): Payer: Medicare (Managed Care) | Admitting: Cardiology

## 2017-03-25 ENCOUNTER — Encounter: Payer: Self-pay | Admitting: *Deleted

## 2017-03-25 VITALS — BP 141/74 | HR 103 | Ht 63.0 in | Wt 177.2 lb

## 2017-03-25 DIAGNOSIS — I482 Chronic atrial fibrillation, unspecified: Secondary | ICD-10-CM

## 2017-03-25 DIAGNOSIS — N186 End stage renal disease: Secondary | ICD-10-CM | POA: Diagnosis not present

## 2017-03-25 DIAGNOSIS — I428 Other cardiomyopathies: Secondary | ICD-10-CM

## 2017-03-25 DIAGNOSIS — I10 Essential (primary) hypertension: Secondary | ICD-10-CM | POA: Diagnosis not present

## 2017-03-25 NOTE — Patient Instructions (Signed)
Continue your current therapy  I will see you in in 6 months.

## 2017-05-19 ENCOUNTER — Other Ambulatory Visit (HOSPITAL_COMMUNITY): Payer: Self-pay | Admitting: Nephrology

## 2017-05-19 DIAGNOSIS — N186 End stage renal disease: Secondary | ICD-10-CM

## 2017-05-25 ENCOUNTER — Ambulatory Visit (HOSPITAL_COMMUNITY): Payer: Medicare (Managed Care)

## 2017-05-27 ENCOUNTER — Encounter (HOSPITAL_COMMUNITY): Payer: Self-pay | Admitting: Interventional Radiology

## 2017-05-27 ENCOUNTER — Ambulatory Visit (HOSPITAL_COMMUNITY)
Admission: RE | Admit: 2017-05-27 | Discharge: 2017-05-27 | Disposition: A | Discharge status: Home / Self Care | Payer: Medicare (Managed Care) | Source: Ambulatory Visit | Attending: Nephrology | Admitting: Nephrology

## 2017-05-27 ENCOUNTER — Other Ambulatory Visit (HOSPITAL_COMMUNITY): Payer: Self-pay | Admitting: Nephrology

## 2017-05-27 DIAGNOSIS — N19 Unspecified kidney failure: Secondary | ICD-10-CM | POA: Insufficient documentation

## 2017-05-27 DIAGNOSIS — Y832 Surgical operation with anastomosis, bypass or graft as the cause of abnormal reaction of the patient, or of later complication, without mention of misadventure at the time of the procedure: Secondary | ICD-10-CM | POA: Insufficient documentation

## 2017-05-27 DIAGNOSIS — N186 End stage renal disease: Secondary | ICD-10-CM

## 2017-05-27 DIAGNOSIS — T82858A Stenosis of vascular prosthetic devices, implants and grafts, initial encounter: Secondary | ICD-10-CM | POA: Insufficient documentation

## 2017-05-27 DIAGNOSIS — T82590A Other mechanical complication of surgically created arteriovenous fistula, initial encounter: Secondary | ICD-10-CM | POA: Diagnosis present

## 2017-05-27 DIAGNOSIS — Z7984 Long term (current) use of oral hypoglycemic drugs: Secondary | ICD-10-CM | POA: Insufficient documentation

## 2017-05-27 DIAGNOSIS — Z79899 Other long term (current) drug therapy: Secondary | ICD-10-CM | POA: Diagnosis not present

## 2017-05-27 DIAGNOSIS — Z7901 Long term (current) use of anticoagulants: Secondary | ICD-10-CM | POA: Diagnosis not present

## 2017-05-27 HISTORY — PX: IR AV DIALY SHUNT INTRO NEEDLE/INTRACATH INITIAL W/PTA/IMG RIGHT: IMG6116

## 2017-05-27 HISTORY — PX: IR US GUIDE VASC ACCESS RIGHT: IMG2390

## 2017-05-27 MED ORDER — IOPAMIDOL (ISOVUE-300) INJECTION 61%
INTRAVENOUS | Status: AC
  Administered 2017-05-27: 25 mL
  Filled 2017-05-27: qty 100

## 2017-05-27 MED ORDER — LIDOCAINE HCL (PF) 1 % IJ SOLN
INTRAMUSCULAR | Status: AC | PRN
  Administered 2017-05-27: 10 mL

## 2017-05-27 MED ORDER — LIDOCAINE HCL 1 % IJ SOLN
INTRAMUSCULAR | Status: AC
  Filled 2017-05-27: qty 20

## 2017-05-27 NOTE — Procedures (Signed)
Interventional Radiology Procedure Note  Procedure: Right UE dialysis circuit study with treatment of recurrent stenosis of the venous outflow, upper arm, with 6 & 7 mm diameter PTA.  No residual after therapy.  .  Complications: None  Recommendations:  - DC now - maintain the retention suture until next dialysis. - OK to use fistula - Do not submerge for 7 days - Routine wound care   Signed,  Dulcy Fanny. Earleen Newport, DO

## 2017-08-29 ENCOUNTER — Ambulatory Visit: Payer: Medicare (Managed Care) | Admitting: Cardiology

## 2017-10-14 ENCOUNTER — Encounter: Payer: Self-pay | Admitting: Physician Assistant

## 2017-10-26 ENCOUNTER — Other Ambulatory Visit (HOSPITAL_COMMUNITY): Payer: Self-pay | Admitting: Nephrology

## 2017-10-26 DIAGNOSIS — N186 End stage renal disease: Secondary | ICD-10-CM

## 2017-10-31 ENCOUNTER — Ambulatory Visit (HOSPITAL_COMMUNITY)
Admission: RE | Admit: 2017-10-31 | Discharge: 2017-10-31 | Disposition: A | Discharge status: Home / Self Care | Payer: Medicare (Managed Care) | Source: Ambulatory Visit | Attending: Nephrology | Admitting: Nephrology

## 2017-10-31 ENCOUNTER — Other Ambulatory Visit (HOSPITAL_COMMUNITY): Payer: Self-pay | Admitting: Nephrology

## 2017-10-31 ENCOUNTER — Encounter (HOSPITAL_COMMUNITY): Payer: Self-pay

## 2017-10-31 DIAGNOSIS — I429 Cardiomyopathy, unspecified: Secondary | ICD-10-CM | POA: Diagnosis not present

## 2017-10-31 DIAGNOSIS — Z885 Allergy status to narcotic agent status: Secondary | ICD-10-CM | POA: Diagnosis not present

## 2017-10-31 DIAGNOSIS — Z86718 Personal history of other venous thrombosis and embolism: Secondary | ICD-10-CM | POA: Diagnosis not present

## 2017-10-31 DIAGNOSIS — Z7984 Long term (current) use of oral hypoglycemic drugs: Secondary | ICD-10-CM | POA: Insufficient documentation

## 2017-10-31 DIAGNOSIS — E079 Disorder of thyroid, unspecified: Secondary | ICD-10-CM | POA: Diagnosis not present

## 2017-10-31 DIAGNOSIS — D631 Anemia in chronic kidney disease: Secondary | ICD-10-CM | POA: Insufficient documentation

## 2017-10-31 DIAGNOSIS — I4891 Unspecified atrial fibrillation: Secondary | ICD-10-CM | POA: Diagnosis not present

## 2017-10-31 DIAGNOSIS — N186 End stage renal disease: Secondary | ICD-10-CM

## 2017-10-31 DIAGNOSIS — Z7901 Long term (current) use of anticoagulants: Secondary | ICD-10-CM | POA: Insufficient documentation

## 2017-10-31 DIAGNOSIS — I509 Heart failure, unspecified: Secondary | ICD-10-CM | POA: Insufficient documentation

## 2017-10-31 DIAGNOSIS — N2581 Secondary hyperparathyroidism of renal origin: Secondary | ICD-10-CM | POA: Diagnosis not present

## 2017-10-31 DIAGNOSIS — E1122 Type 2 diabetes mellitus with diabetic chronic kidney disease: Secondary | ICD-10-CM | POA: Diagnosis not present

## 2017-10-31 DIAGNOSIS — I132 Hypertensive heart and chronic kidney disease with heart failure and with stage 5 chronic kidney disease, or end stage renal disease: Secondary | ICD-10-CM | POA: Insufficient documentation

## 2017-10-31 DIAGNOSIS — Z87891 Personal history of nicotine dependence: Secondary | ICD-10-CM | POA: Diagnosis not present

## 2017-10-31 DIAGNOSIS — Z882 Allergy status to sulfonamides status: Secondary | ICD-10-CM | POA: Insufficient documentation

## 2017-10-31 DIAGNOSIS — Z86711 Personal history of pulmonary embolism: Secondary | ICD-10-CM | POA: Diagnosis not present

## 2017-10-31 DIAGNOSIS — Y832 Surgical operation with anastomosis, bypass or graft as the cause of abnormal reaction of the patient, or of later complication, without mention of misadventure at the time of the procedure: Secondary | ICD-10-CM | POA: Diagnosis not present

## 2017-10-31 DIAGNOSIS — T82898A Other specified complication of vascular prosthetic devices, implants and grafts, initial encounter: Secondary | ICD-10-CM | POA: Insufficient documentation

## 2017-10-31 HISTORY — PX: IR AV DIALY SHUNT INTRO NEEDLE/INTRACATH INITIAL W/PTA/IMG RIGHT: IMG6116

## 2017-10-31 HISTORY — PX: IR US GUIDE VASC ACCESS RIGHT: IMG2390

## 2017-10-31 MED ORDER — IOPAMIDOL (ISOVUE-300) INJECTION 61%
INTRAVENOUS | Status: AC
  Administered 2017-10-31: 66 mL
  Filled 2017-10-31: qty 100

## 2017-10-31 MED ORDER — LIDOCAINE HCL (PF) 1 % IJ SOLN
INTRAMUSCULAR | Status: DC | PRN
  Administered 2017-10-31: 2 mL

## 2017-10-31 MED ORDER — LIDOCAINE HCL 1 % IJ SOLN
INTRAMUSCULAR | Status: AC
  Filled 2017-10-31: qty 20

## 2017-10-31 NOTE — H&P (Signed)
Chief Complaint: Patient was seen in consultation today for right arm dialysis graft shuntogram with possible angioplasty/stent placement at the request of [place]  Referring Physician(s): [place]  Supervising Physician: Markus Daft  Patient Status: [facility] - Out-pt  History of Present Illness: Kelly Gallegos is a 67 y.o. female   Right arm dialysis graft slow flow; poor function Scheduled for evaluation and possible angioplasty/stent placement  Last intervention: 05/27/17:  IMPRESSION: Status post right upper extremity dialysis circuit study with treatment of high-grade stenosis of the venous outflow of the upper arm with 6 mm and 8 mm balloon angioplasty.   Past Medical History:  Diagnosis Date  . Anemia    Anemia of chronic disease  . Atrial fibrillation ([place])   . Cardiomyopathy ([place])    presumed non-ischemic due to no ischemia on stress 07/2010 [facility])  . CHF (congestive heart failure) ([place])   . Chronic kidney disease    T TH SAT [address]  . Diabetes mellitus    Type II  . DVT (deep venous thrombosis) (H[facility]) approx 1999  . Hypertension   . Necrotizing fasciitis ([place]) 2012  . Pneumonia 2010  . Pulmonary embolus (H[facility]) approx 1999  . Shortness of breath   . Thyroid disease    Secondary Hyperparathyroidism    Past Surgical History:  Procedure Laterality Date  . AV FISTULA PLACEMENT  02/10/2011   Left Brachiocephalic Arteriovenous Fistula Formation  . AV FISTULA PLACEMENT  12/10/2011   Procedure: INSERTION OF ARTERIOVENOUS (AV) GORE-TEX GRAFT ARM;  Surgeon: Angelia Mould, MD;  Location: [place];  Service: Vascular;  Laterality: Right;  . AVF revision  07-23-2011   left  . BACK SURGERY  10-2010   pt. 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 CESAREAN SECTION    . FISTULOGRAM Left 07/05/2011   Procedure: FISTULOGRAM;  Surgeon: Angelia Mould, MD;  Location: [facility] CATH LAB;  Service: Cardiovascular;   Laterality: Left;  . IR AV DIALY SHUNT INTRO NEEDLE/INTRACATH INITIAL W/PTA/IMG RIGHT Right 11/19/2016  . IR AV DIALY SHUNT INTRO NEEDLE/INTRACATH INITIAL W/PTA/IMG RIGHT Right 03/14/2017  . IR AV DIALY SHUNT INTRO NEEDLE/INTRACATH INITIAL W/PTA/IMG RIGHT Right 05/27/2017  . IR GENERIC HISTORICAL Right 12/29/2015   IR AV DIALY SHUNT INTRO NEEDLE/INTRACATH INITIAL W/PTA/IMG RIGHT 12/29/2015 Arne Cleveland, MD [facility]INTER[facility]  . IR GENERIC HISTORICAL  06/25/2016   IR US GUIDE VASC ACCESS RIGHT 06/25/2016 Greggory Keen, MD [facility]INTER[facility]  . IR GENERIC HISTORICAL Right 06/25/2016   IR AV DIALY SHUNT INTRO NEEDLE/INTRACATH INITIAL W/PTA/IMG RIGHT 06/25/2016 Greggory Keen, MD [facility]  . IR GENERIC HISTORICAL Right 07/12/2016   IR AV DIALY SHUNT INTRO NEEDLE/INTRACATH INITIAL W/PTA/IMG RIGHT 07/12/2016 Sandi Mariscal, MD [facility]  . IR US GUIDE VASC ACCESS RIGHT  05/27/2017  . SALIVARY GLAND SURGERY     stone removed    Allergies: Sulfa antibiotics and Oxycodone  Medications: Prior to Admission medications   Medication Sig Start Date End Date Taking? Authorizing Provider  acetaminophen (TYLENOL) 500 MG tablet Take 500 mg by mouth every 4 (four) hours as needed for moderate pain or headache.     [provider]  calcium carbonate (TUMS - DOSED IN MG ELEMENTAL CALCIUM) 500 MG chewable tablet Chew 1 tablet by mouth 3 (three) times daily before meals. Also before snacks    [provider]  cetirizine (ZYRTEC) 5 MG tablet Take 5 mg by mouth daily.    [provider]  cholestyramine (QUESTRAN) 4 g packet Take 4 g by mouth daily as needed.    [provider]  digoxin (LANOXIN) 0.125 MG tablet Take 125 mcg by mouth 3 (three) times a week. Monday, Wednesday, Friday    [provider]  folic acid-vitamin b complex-vitamin c-selenium-zinc (DIALYVITE) 3 MG TABS Take 1 tablet by mouth daily.    [provider]  glipiZIDE (GLUCOTROL) 5 MG tablet Take 5 mg by  mouth 2 (two) times daily.     [provider]  Hypromellose (NATURES TEARS OP) Apply 1 drop to eye 2 (two) times daily as needed (irritated eyes).     [provider]  Lidocaine-Prilocaine, Bulk, 2.5-2.5 % CREA Apply 1 application topically 3 (three) times a week.     [provider]  loperamide (IMODIUM A-D) 2 MG tablet Take 2 mg by mouth every 4 (four) hours as needed for diarrhea or loose stools.    [provider]  miconazole (MICOTIN) 2 % powder Apply 1 application topically as needed for itching.    [provider]  midodrine (PROAMATINE) 10 MG tablet Take 10 mg by mouth 2 (two) times daily.    [provider]  simethicone (MYLICON) 250 MG chewable tablet Chew 125 mg by mouth every 6 (six) hours as needed for flatulence.    [provider]  Skin Protectants, Misc. (EUCERIN) cream Apply topically as needed for dry skin.    [provider]  warfarin (COUMADIN) 3 MG tablet Take 1.5-3 mg by mouth See admin instructions. Pt takes 1.5mg  on M, W, F - takes 3mg  on Tu, Thu, Sa, Su    [provider]     Family History  Problem Relation Age of Onset  . Cancer Mother 71  . Heart disease Father 1  . Cancer Sister   . Anesthesia problems Neg Hx   . Hypotension Neg Hx   . Malignant hyperthermia Neg Hx   . Pseudochol deficiency Neg Hx     Social History   Socioeconomic History  . Marital status: Legally Separated    Spouse name: Not on file  . Number of children: Not on file  . Years of education: Not on file  . Highest education level: Not on file  Occupational History  . Not on file  Social Needs  . Financial resource strain: Not on file  . Food insecurity:    Worry: Not on file    Inability: Not on file  . Transportation needs:    Medical: Not on file    Non-medical: Not on file  Tobacco Use  . Smoking status: Former Smoker    Years: 20.00    Types: Cigarettes    Last attempt to quit: 05/31/2010     Years since quitting: 7.4  . Smokeless tobacco: Never Used  Substance and Sexual Activity  . Alcohol use: No    Alcohol/week: 0.0 oz  . Drug use: No  . Sexual activity: Not Currently    Birth control/protection: Post-menopausal  Lifestyle  . Physical activity:    Days per week: Not on file    Minutes per session: Not on file  . Stress: Not on file  Relationships  . Social connections:    Talks on phone: Not on file    Gets together: Not on file    Attends religious service: Not on file    Active member of club or organization: Not on file    Attends meetings of clubs or organizations:  Not on file    Relationship status: Not on file  Other Topics Concern  . Not on file  Social History Narrative  . Not on file    Review of Systems: A 12 point ROS discussed and pertinent positives are indicated in the HPI above.  All other systems are negative.  Review of Systems  Constitutional: Positive for fatigue. Negative for activity change and fever.  Neurological: Positive for weakness.  Psychiatric/Behavioral: Negative for behavioral problems and confusion.    Vital Signs: There were no vitals taken for this visit.  Physical Exam  Constitutional: She is oriented to person, place, and time.  Cardiovascular: Normal rate and regular rhythm.  Pulmonary/Chest: Effort normal and breath sounds normal.  Abdominal: Soft.  Musculoskeletal: Normal range of motion.  Neurological: She is alert and oriented to person, place, and time.  Skin: Skin is warm and dry.  Psychiatric: She has a normal mood and affect. Her behavior is normal. Judgment and thought content normal.  Vitals reviewed.   Imaging: No results found.  Labs:  CBC: Recent Labs    12/06/16 0717  WBC 7.0  HGB 11.1*  HCT 36.9  PLT 189    COAGS: Recent Labs    12/06/16 0945  INR 2.30    BMP: Recent Labs    12/06/16 0717  NA 137  K 4.1  CL 98*  CO2 25  GLUCOSE 106*  BUN 21*  CALCIUM 9.9  CREATININE  4.80*  GFRNONAA 9*  GFRAA 10*    LIVER FUNCTION TESTS: No results for input(s): BILITOT, AST, ALT, ALKPHOS, PROT, ALBUMIN in the last 8760 hours.  TUMOR MARKERS: No results for input(s): AFPTM, CEA, CA199, CHROMGRNA in the last 8760 hours.  Assessment and Plan:  Right arm dialysis graft poor function Scheduled for shuntogram with possible angioplasty/stent placement Pt is aware of procedure benefits and risks including but not limited to Infection; bleeding; damage to graft Agreeable to proceed; consent signed and in cjart  Thank you for this interesting consult.  I greatly enjoyed meeting Kelly Gallegos and look forward to participating in their care.  A copy of this report was sent to the requesting provider on this date.  Electronically Signed: Lavonia Drafts, PA-C 10/31/2017, 11:14 AM   I spent a total of    25 Minutes in face to face in clinical consultation, greater than 50% of which was counseling/coordinating care for right arm dialysis graft shuntogram

## 2017-10-31 NOTE — Procedures (Signed)
Right upper extremity shuntogram with graft and venous angioplasty.  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  Graft stenosis treated with 6 x 40 mm balloon.  Venous stenosis treated with 6 and 8 mm balloon angioplasty.  Excellent flow through shunt after intervention, no immediate complication.  Minimal blood loss.  See full report in Imaging.

## 2017-11-04 ENCOUNTER — Other Ambulatory Visit: Payer: Medicare (Managed Care)

## 2017-11-04 ENCOUNTER — Encounter: Payer: Self-pay | Admitting: Physician Assistant

## 2017-11-04 ENCOUNTER — Ambulatory Visit (INDEPENDENT_AMBULATORY_CARE_PROVIDER_SITE_OTHER): Payer: Medicare (Managed Care) | Admitting: Physician Assistant

## 2017-11-04 VITALS — BP 102/60 | HR 78 | Ht 62.0 in | Wt 164.0 lb

## 2017-11-04 DIAGNOSIS — K529 Noninfective gastroenteritis and colitis, unspecified: Secondary | ICD-10-CM | POA: Diagnosis not present

## 2017-11-04 DIAGNOSIS — Z7901 Long term (current) use of anticoagulants: Secondary | ICD-10-CM

## 2017-11-04 DIAGNOSIS — Z992 Dependence on renal dialysis: Secondary | ICD-10-CM

## 2017-11-04 MED ORDER — DIPHENOXYLATE-ATROPINE 2.5-0.025 MG PO TABS: 1.0000 | ORAL_TABLET | Freq: Four times a day (QID) | ORAL | 1 refills | Status: AC

## 2017-11-04 NOTE — Patient Instructions (Addendum)
If you are age 68 or older, your body mass index should be between 23-30. Your Body mass index is 30 kg/m. If this is out of the aforementioned range listed, please consider follow up with your Primary Care Provider.  If you are age 26 or younger, your body mass index should be between 19-25. Your Body mass index is 30 kg/m. If this is out of the aformentioned range listed, please consider follow up with your Primary Care Provider.   Your provider has requested that you go to the basement level for lab work before leaving today. Press "B" on the elevator. The lab is located at the first door on the left as you exit the elevator.   You have been given a prescription for Lomotil today.  STOP Cholestyramine and Imodium for now.  Follow up with me on November 18, 2017 at 8:45 am.  Thank you for choosing me and [facility].   Ellouise Newer, PA-C

## 2017-11-04 NOTE — Progress Notes (Signed)
Chief Complaint: Chronic diarrhea  HPI:    Kelly Gallegos is a 67 year old female with a past medical history of A. fib (echo 01/12/2017 with LVEF 30-35%) on Coumadin, CKD on dialysis, diabetes and others listed below, who was referred to me by Janifer Adie, MD for a complaint of chronic diarrhea.      Today, explains that for 5 years now she has struggled with loose stools throughout the day, initially this was just after she would eat a meal and would have to rush to the toilet, now it is becoming more frequent and it is so urgent that sometimes she has accidents.  Believes it has been getting worse over the past 6 months, describes no solid stools at all, just loose water.  Accompanying lower abdominal discomfort sometimes before and sometimes after a bowel movement.    Patient has never had a colonoscopy.    Denies fever, chills, blood in her stool, melena, weight loss, anorexia, nausea, vomiting, heartburn, reflux or symptoms that awaken her at night.  Past Medical History:  Diagnosis Date  . Anemia    Anemia of chronic disease  . Atrial fibrillation ([place])   . Cardiomyopathy ([place])    presumed non-ischemic due to no ischemia on stress 07/2010 [facility])  . CHF (congestive heart failure) ([place])   . Chronic kidney disease    T TH SAT [address]  . Diabetes mellitus    Type II  . DVT (deep venous thrombosis) ([facility]) approx 1999  . Hypertension   . Necrotizing fasciitis ([place]) 2012  . Pneumonia 2010  . Pulmonary embolus ([facility]) approx 1999  . Shortness of breath   . Thyroid disease    Secondary Hyperparathyroidism    Past Surgical History:  Procedure Laterality Date  . AV FISTULA PLACEMENT  02/10/2011   Left Brachiocephalic Arteriovenous Fistula Formation  . AV FISTULA PLACEMENT  12/10/2011   Procedure: INSERTION OF ARTERIOVENOUS (AV) GORE-TEX GRAFT ARM;  Surgeon: Angelia Mould, MD;  Location: [place];  Service: Vascular;  Laterality: Right;  . AVF revision  07-23-2011   left  .  BACK SURGERY  10-2010   pt. 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 CESAREAN SECTION    . FISTULOGRAM Left 07/05/2011   Procedure: FISTULOGRAM;  Surgeon: Angelia Mould, MD;  Location: [facility] CATH LAB;  Service: Cardiovascular;  Laterality: Left;  . IR AV DIALY SHUNT INTRO NEEDLE/INTRACATH INITIAL W/PTA/IMG RIGHT Right 11/19/2016  . IR AV DIALY SHUNT INTRO NEEDLE/INTRACATH INITIAL W/PTA/IMG RIGHT Right 03/14/2017  . IR AV DIALY SHUNT INTRO NEEDLE/INTRACATH INITIAL W/PTA/IMG RIGHT Right 05/27/2017  . IR AV DIALY SHUNT INTRO NEEDLE/INTRACATH INITIAL W/PTA/IMG RIGHT Right 10/31/2017  . IR GENERIC HISTORICAL Right 12/29/2015   IR AV DIALY SHUNT INTRO NEEDLE/INTRACATH INITIAL W/PTA/IMG RIGHT 12/29/2015 Arne Cleveland, MD [facility]INTER[facility]  . IR GENERIC HISTORICAL  06/25/2016   IR US GUIDE VASC ACCESS RIGHT 06/25/2016 Greggory Keen, MD [facility]  . IR GENERIC HISTORICAL Right 06/25/2016   IR AV DIALY SHUNT INTRO NEEDLE/INTRACATH INITIAL W/PTA/IMG RIGHT 06/25/2016 Greggory Keen, MD [facility]  . IR GENERIC HISTORICAL Right 07/12/2016   IR AV DIALY SHUNT INTRO NEEDLE/INTRACATH INITIAL W/PTA/IMG RIGHT 07/12/2016 Sandi Mariscal, MD [facility]  . IR US GUIDE VASC ACCESS RIGHT  05/27/2017  . IR US GUIDE VASC ACCESS RIGHT  10/31/2017  . SALIVARY GLAND SURGERY     stone removed    Current Outpatient Medications  Medication Sig Dispense  Refill  . acetaminophen (TYLENOL) 500 MG tablet Take 500 mg by mouth every 4 (four) hours as needed for moderate pain or headache.     . calcium carbonate (TUMS - DOSED IN MG ELEMENTAL CALCIUM) 500 MG chewable tablet Chew 1 tablet by mouth 3 (three) times daily before meals. Also before snacks    . cetirizine (ZYRTEC) 5 MG tablet Take 5 mg by mouth daily.    . cholestyramine (QUESTRAN) 4 g packet Take 4 g by mouth daily as needed.    . digoxin (LANOXIN) 0.125 MG tablet Take 125 mcg by mouth 3 (three) times a week. Monday, Wednesday, Friday     . folic acid-vitamin b complex-vitamin c-selenium-zinc (DIALYVITE) 3 MG TABS Take 1 tablet by mouth daily.    Marland Kitchen glipiZIDE (GLUCOTROL) 5 MG tablet Take 5 mg by mouth 2 (two) times daily.     . Hypromellose (NATURES TEARS OP) Apply 1 drop to eye 2 (two) times daily as needed (irritated eyes).     . Lidocaine-Prilocaine, Bulk, 2.5-2.5 % CREA Apply 1 application topically 3 (three) times a week.     . loperamide (IMODIUM A-D) 2 MG tablet Take 2 mg by mouth every 4 (four) hours as needed for diarrhea or loose stools.    . miconazole (MICOTIN) 2 % powder Apply 1 application topically as needed for itching.    . midodrine (PROAMATINE) 10 MG tablet Take 10 mg by mouth 2 (two) times daily.    . simethicone (MYLICON) 759 MG chewable tablet Chew 125 mg by mouth every 6 (six) hours as needed for flatulence.    . Skin Protectants, Misc. (EUCERIN) cream Apply topically as needed for dry skin.    Marland Kitchen warfarin (COUMADIN) 3 MG tablet Take 1.5-3 mg by mouth See admin instructions. Pt takes 1.5mg  on M, W, F - takes 3mg  on Tu, Thu, Sa, Su    . diphenoxylate-atropine (LOMOTIL) 2.5-0.025 MG tablet Take 1 tablet by mouth 4 (four) times daily. 120 tablet 1   No current facility-administered medications for this visit.     Allergies as of 11/04/2017 - Review Complete 11/04/2017  Allergen Reaction Noted  . Sulfa antibiotics Other (See Comments) 06/11/2011  . Oxycodone  12/17/2016    Family History  Problem Relation Age of Onset  . Cancer Mother 46  . Heart disease Father 50  . Cancer Sister   . Anesthesia problems Neg Hx   . Hypotension Neg Hx   . Malignant hyperthermia Neg Hx   . Pseudochol deficiency Neg Hx     Social History   Socioeconomic History  . Marital status: Legally Separated    Spouse name: Not on file  . Number of children: Not on file  . Years of education: Not on file  . Highest education level: Not on file  Occupational History  . Not on file  Social Needs  . Financial resource  strain: Not on file  . Food insecurity:    Worry: Not on file    Inability: Not on file  . Transportation needs:    Medical: Not on file    Non-medical: Not on file  Tobacco Use  . Smoking status: Former Smoker    Years: 20.00    Types: Cigarettes    Last attempt to quit: 05/31/2010    Years since quitting: 7.4  . Smokeless tobacco: Never Used  Substance and Sexual Activity  . Alcohol use: No    Alcohol/week: 0.0 oz  . Drug use: No  .  Sexual activity: Not Currently    Birth control/protection: Post-menopausal  Lifestyle  . Physical activity:    Days per week: Not on file    Minutes per session: Not on file  . Stress: Not on file  Relationships  . Social connections:    Talks on phone: Not on file    Gets together: Not on file    Attends religious service: Not on file    Active member of club or organization: Not on file    Attends meetings of clubs or organizations: Not on file    Relationship status: Not on file  . Intimate partner violence:    Fear of current or ex partner: Not on file    Emotionally abused: Not on file    Physically abused: Not on file    Forced sexual activity: Not on file  Other Topics Concern  . Not on file  Social History Narrative  . Not on file    Review of Systems:    Constitutional: No weight loss, fever or chills Skin: No rash  Cardiovascular: No chest pain Respiratory: No SOB  Gastrointestinal: See HPI and otherwise negative Genitourinary: No dysuria Neurological: No headache Musculoskeletal: No new muscle or joint pain Hematologic: No bleeding Psychiatric: No history of depression or anxiety   Physical Exam:  Vital signs: BP 102/60   Pulse 78   Ht 5\' 2"  (1.575 m)   Wt 164 lb (74.4 kg)   BMI 30.00 kg/m   Constitutional:   Pleasant chronically ill appearing Caucasian female appears to be in NAD, Well developed, Well nourished, alert and cooperative Head:  Normocephalic and atraumatic. Eyes:   PEERL, EOMI. No icterus.  Conjunctiva pink. Ears:  Normal auditory acuity. Neck:  Supple Throat: Oral cavity and pharynx without inflammation, swelling or lesion.  Respiratory: Respirations even and unlabored. Lungs clear to auscultation bilaterally.   No wheezes, crackles, or rhonchi.  Cardiovascular: Normal S1, S2. No MRG. Regular rate and rhythm. No peripheral edema, cyanosis or pallor.  Gastrointestinal:  Soft, nondistended, nontender. No rebound or guarding. Normal bowel sounds. No appreciable masses or hepatomegaly. Rectal:  Not performed.  Msk:  Symmetrical without gross deformities. Without edema, no deformity or joint abnormality. Ambulates in wheelchair Neurologic: Alert and  oriented x4;  grossly normal neurologically.  Skin:   Dry and intact without significant lesions or rashes. Psychiatric: Demonstrates good judgement and reason without abnormal affect or behaviors.  No recent labs.  Assessment: 1.  Chronic diarrhea: 5-year history, worsening over the past 6 months, no previous work-up; consider infectious versus IBS versus other 2.  Chronic anticoagulation: For A. fib on Coumadin 3.  CKD on HD  Plan: 1.  Ordered stool studies to include a GI pathogen panel, O&P and fecal pancreatic elastase 2.  Prescribed Lomotil 1 tab scheduled 3 times daily #120 with a refill 3.  Recommend patient hold her Cholestyramine and Imodium for now as she does not believe these are helping anyways. 4.  Patient will follow in clinic with me in 2-3 weeks.  Pending results of above we will discuss a colonoscopy.  This would need to be scheduled in the hospital due to decreased EF and chronic anticoagulation.  Patient was assigned to Dr. Ardis Hughs today.  Ellouise Newer, PA-C [facility] 11/04/2017, 11:44 AM  Cc: Janifer Adie, MD

## 2017-11-06 NOTE — Progress Notes (Signed)
I agree with the above note, plan 

## 2017-11-08 LAB — GASTROINTESTINAL PATHOGEN PANEL PCR
C. DIFFICILE TOX A/B, PCR: NOT DETECTED
CAMPYLOBACTER, PCR: NOT DETECTED
CRYPTOSPORIDIUM, PCR: NOT DETECTED
E COLI 0157, PCR: NOT DETECTED
E coli (ETEC) LT/ST PCR: NOT DETECTED
E coli (STEC) stx1/stx2, PCR: NOT DETECTED
GIARDIA LAMBLIA, PCR: NOT DETECTED
Norovirus, PCR: NOT DETECTED
ROTAVIRUS, PCR: NOT DETECTED
SALMONELLA, PCR: NOT DETECTED
Shigella, PCR: NOT DETECTED

## 2017-11-08 LAB — OVA AND PARASITE EXAMINATION
CONCENTRATE RESULT: NONE SEEN
SPECIMEN QUALITY:: ADEQUATE
TRICHROME RESULT: NONE SEEN
VKL: 90686781

## 2017-11-08 LAB — CLOSTRIDIUM DIFFICILE TOXIN B, QUALITATIVE, REAL-TIME PCR: Toxigenic C. Difficile by PCR: NOT DETECTED

## 2017-11-10 ENCOUNTER — Telehealth: Payer: Self-pay | Admitting: Cardiology

## 2017-11-10 NOTE — Telephone Encounter (Signed)
The Nurse Practitioner called from [place]. She stated that the patient has had chronic diarrhea for years now. She has maxed out out her imodium and other medications. She was started on lomotil which has had helped some. She stated that stool cultures have come back negative (results in epic)   The NP is concerned that it could be the digoxin. Message routed to the provider for his knowledge and recommendation.

## 2017-11-10 NOTE — Telephone Encounter (Signed)
New message     Pt c/o medication issue:  1. Name of Medication: digoxin (LANOXIN) 0.125 MG tablet  2. How are you currently taking this medication (dosage and times per day)?   3. Are you having a reaction (difficulty breathing--STAT)? NO  4. What is your medication issue? Cletis Athens- NP at [facility], calling on behalf of the patient. Patient has chronic diarrhea.  Please call [phone].

## 2017-11-10 NOTE — Telephone Encounter (Signed)
Left a message to call back.

## 2017-11-11 NOTE — Telephone Encounter (Signed)
We should check a dig level and see if she is at toxic level.   Mayuri Staples [place] MD, [facility]

## 2017-11-11 NOTE — Telephone Encounter (Signed)
Returned call to Cletis Athens NP with [facility] left message on personal voice mail Dr.Jordan's recommendation.

## 2017-11-18 ENCOUNTER — Ambulatory Visit (INDEPENDENT_AMBULATORY_CARE_PROVIDER_SITE_OTHER): Payer: Medicare (Managed Care) | Admitting: Physician Assistant

## 2017-11-18 ENCOUNTER — Telehealth: Payer: Self-pay | Admitting: Physician Assistant

## 2017-11-18 ENCOUNTER — Encounter: Payer: Self-pay | Admitting: Physician Assistant

## 2017-11-18 VITALS — BP 100/62 | HR 70 | Ht 62.0 in | Wt 163.4 lb

## 2017-11-18 DIAGNOSIS — K529 Noninfective gastroenteritis and colitis, unspecified: Secondary | ICD-10-CM

## 2017-11-18 DIAGNOSIS — Z992 Dependence on renal dialysis: Secondary | ICD-10-CM

## 2017-11-18 MED ORDER — PEG 3350-KCL-NA BICARB-NACL 420 G PO SOLR: 4000.0000 mL | Freq: Once | ORAL | 0 refills | Status: AC

## 2017-11-18 NOTE — Progress Notes (Signed)
Chief Complaint: Follow up chronic diarrhea  HPI:    Kelly Gallegos is a 67 year old female with a past medical history as listed below including A. Fib on Coumadin (echo 01/12/2017 with LVEF 30-35%) on Coumadin, CKD on dialysis and diabetes, who was referred to me by Janifer Adie, MD for follow-up of chronic diarrhea.      11/04/2017 office visit describe 5 years of struggling with loose stools throughout the day.  Patient has never had a colonoscopy.  Stool studies including GI pathogen panel, O&P and fecal pancreatic elastase were normal.  Lomotil was prescribed 1 tab scheduled 3 times a day and it was recommended she hold her Cholestyramine and Imodium for now as she did not believe they were helping.  It was recommended that if above measures did not help she would need to be scheduled for a colonoscopy in the hospital due to her decreased EF and chronic anticoagulation.    Today, explains that using the Lomotil at first it helped to kind of slow things down but now she is back to her chronic loose stools multiple times a day.  Also continues with lower abdominal discomfort before bowel movement.    Denies weight loss, fever, chills or blood in her stool.  Past Medical History:  Diagnosis Date  . Anemia    Anemia of chronic disease  . Atrial fibrillation ([place])   . Cardiomyopathy ([place])    presumed non-ischemic due to no ischemia on stress 07/2010 [facility])  . CHF (congestive heart failure) ([place])   . Chronic kidney disease    T TH SAT [address]  . Diabetes mellitus    Type II  . DVT (deep venous thrombosis) ([facility]) approx 1999  . Hypertension   . Necrotizing fasciitis ([place]) 2012  . Pneumonia 2010  . Pulmonary embolus ([facility]) approx 1999  . Shortness of breath   . Thyroid disease    Secondary Hyperparathyroidism    Past Surgical History:  Procedure Laterality Date  . AV FISTULA PLACEMENT  02/10/2011   Left Brachiocephalic Arteriovenous Fistula Formation  . AV FISTULA PLACEMENT   12/10/2011   Procedure: INSERTION OF ARTERIOVENOUS (AV) GORE-TEX GRAFT ARM;  Surgeon: Angelia Mould, MD;  Location: [facility];  Service: Vascular;  Laterality: Right;  . AVF revision  07-23-2011   left  . BACK SURGERY  10-2010   pt. 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 CESAREAN SECTION    . FISTULOGRAM Left 07/05/2011   Procedure: FISTULOGRAM;  Surgeon: Angelia Mould, MD;  Location: [facility] CATH LAB;  Service: Cardiovascular;  Laterality: Left;  . IR AV DIALY SHUNT INTRO NEEDLE/INTRACATH INITIAL W/PTA/IMG RIGHT Right 11/19/2016  . IR AV DIALY SHUNT INTRO NEEDLE/INTRACATH INITIAL W/PTA/IMG RIGHT Right 03/14/2017  . IR AV DIALY SHUNT INTRO NEEDLE/INTRACATH INITIAL W/PTA/IMG RIGHT Right 05/27/2017  . IR AV DIALY SHUNT INTRO NEEDLE/INTRACATH INITIAL W/PTA/IMG RIGHT Right 10/31/2017  . IR GENERIC HISTORICAL Right 12/29/2015   IR AV DIALY SHUNT INTRO NEEDLE/INTRACATH INITIAL W/PTA/IMG RIGHT 12/29/2015 Arne Cleveland, MD [facility]  . IR GENERIC HISTORICAL  06/25/2016   IR US GUIDE VASC ACCESS RIGHT 06/25/2016 Greggory Keen, MD [facility]  . IR GENERIC HISTORICAL Right 06/25/2016   IR AV DIALY SHUNT INTRO NEEDLE/INTRACATH INITIAL W/PTA/IMG RIGHT 06/25/2016 Greggory Keen, MD [facility]  . IR GENERIC HISTORICAL Right 07/12/2016   IR AV DIALY SHUNT INTRO NEEDLE/INTRACATH INITIAL W/PTA/IMG RIGHT 07/12/2016 Sandi Mariscal, MD [facility]  . IR  US GUIDE VASC ACCESS RIGHT  05/27/2017  . IR US GUIDE VASC ACCESS RIGHT  10/31/2017  . SALIVARY GLAND SURGERY     stone removed    Current Outpatient Medications  Medication Sig Dispense Refill  . acetaminophen (TYLENOL) 500 MG tablet Take 500 mg by mouth every 4 (four) hours as needed for moderate pain or headache.     . calcium carbonate (TUMS - DOSED IN MG ELEMENTAL CALCIUM) 500 MG chewable tablet Chew 1 tablet by mouth 3 (three) times daily before meals. Also before snacks    . cetirizine (ZYRTEC) 5 MG tablet  Take 5 mg by mouth daily.    . cholestyramine (QUESTRAN) 4 g packet Take 4 g by mouth daily as needed.    . digoxin (LANOXIN) 0.125 MG tablet Take 125 mcg by mouth 3 (three) times a week. Monday, Wednesday, Friday    . diphenoxylate-atropine (LOMOTIL) 2.5-0.025 MG tablet Take 1 tablet by mouth 4 (four) times daily. 858 tablet 1  . folic acid-vitamin b complex-vitamin c-selenium-zinc (DIALYVITE) 3 MG TABS Take 1 tablet by mouth daily.    Marland Kitchen glipiZIDE (GLUCOTROL) 5 MG tablet Take 5 mg by mouth 2 (two) times daily.     . Hypromellose (NATURES TEARS OP) Apply 1 drop to eye 2 (two) times daily as needed (irritated eyes).     . Lidocaine-Prilocaine, Bulk, 2.5-2.5 % CREA Apply 1 application topically 3 (three) times a week.     . loperamide (IMODIUM A-D) 2 MG tablet Take 2 mg by mouth every 4 (four) hours as needed for diarrhea or loose stools.    . miconazole (MICOTIN) 2 % powder Apply 1 application topically as needed for itching.    . midodrine (PROAMATINE) 10 MG tablet Take 10 mg by mouth 2 (two) times daily.    . simethicone (MYLICON) 850 MG chewable tablet Chew 125 mg by mouth every 6 (six) hours as needed for flatulence.    . Skin Protectants, Misc. (EUCERIN) cream Apply topically as needed for dry skin.    Marland Kitchen warfarin (COUMADIN) 3 MG tablet Take 1.5-3 mg by mouth See admin instructions. Pt takes 1.5mg  on M, W, F - takes 3mg  on Tu, Thu, Sa, Su     No current facility-administered medications for this visit.     Allergies as of 11/18/2017 - Review Complete 11/04/2017  Allergen Reaction Noted  . Sulfa antibiotics Other (See Comments) 06/11/2011  . Oxycodone  12/17/2016    Family History  Problem Relation Age of Onset  . Cancer Mother 72  . Heart disease Father 80  . Cancer Sister   . Anesthesia problems Neg Hx   . Hypotension Neg Hx   . Malignant hyperthermia Neg Hx   . Pseudochol deficiency Neg Hx     Social History   Socioeconomic History  . Marital status: Legally Separated     Spouse name: Not on file  . Number of children: Not on file  . Years of education: Not on file  . Highest education level: Not on file  Occupational History  . Not on file  Social Needs  . Financial resource strain: Not on file  . Food insecurity:    Worry: Not on file    Inability: Not on file  . Transportation needs:    Medical: Not on file    Non-medical: Not on file  Tobacco Use  . Smoking status: Former Smoker    Years: 20.00    Types: Cigarettes    Last attempt  to quit: 05/31/2010    Years since quitting: 7.4  . Smokeless tobacco: Never Used  Substance and Sexual Activity  . Alcohol use: No    Alcohol/week: 0.0 oz  . Drug use: No  . Sexual activity: Not Currently    Birth control/protection: Post-menopausal  Lifestyle  . Physical activity:    Days per week: Not on file    Minutes per session: Not on file  . Stress: Not on file  Relationships  . Social connections:    Talks on phone: Not on file    Gets together: Not on file    Attends religious service: Not on file    Active member of club or organization: Not on file    Attends meetings of clubs or organizations: Not on file    Relationship status: Not on file  . Intimate partner violence:    Fear of current or ex partner: Not on file    Emotionally abused: Not on file    Physically abused: Not on file    Forced sexual activity: Not on file  Other Topics Concern  . Not on file  Social History Narrative  . Not on file    Review of Systems:    Constitutional: No weight loss, fever or chills Cardiovascular: No chest pain Respiratory: No SOB  Gastrointestinal: See HPI and otherwise negative   Physical Exam:  Vital signs: BP 100/62   Pulse 70   Ht 5\' 2"  (1.575 m)   Wt 163 lb 6.4 oz (74.1 kg)   SpO2 96%   BMI 29.89 kg/m    Constitutional:   Pleasant chronically ill appearing Caucasian female appears to be in NAD, Well developed, Well nourished, alert and cooperative Respiratory: Respirations even and  unlabored. Lungs clear to auscultation bilaterally.   No wheezes, crackles, or rhonchi.  Cardiovascular: Normal S1, S2. No MRG. Regular rate and rhythm. No peripheral edema, cyanosis or pallor.  Gastrointestinal:  Soft, nondistended, nontender. No rebound or guarding. Normal bowel sounds. No appreciable masses or hepatomegaly. 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  See HPI recent labs.  Assessment: 1.  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  Screening for colorectal cancer: She has never had a colonoscopy  Plan: 1.  Scheduled patient for a colonoscopy at the hospital.  Discussed risk, benefits, limitations and alternatives and the patient agrees to proceed. 2.  Recommend the patient discontinue Lomotil during prep 3. Will need to hold Coumadin for 5 days. We will discuss with her physician to ensure this is acceptable for her. 4.  Patient to follow in clinic per recommendations from Dr. Ardis Hughs after time of procedure.  Ellouise Newer, PA-C [facility] 11/18/2017, 8:56 AM  Cc: Janifer Adie, MD

## 2017-11-18 NOTE — H&P (View-Only) (Signed)
Chief Complaint: Follow up chronic diarrhea  HPI:    Kelly Gallegos is a 67 year old female with a past medical history as listed below including A. Fib on Coumadin (echo 01/12/2017 with LVEF 30-35%) on Coumadin, CKD on dialysis and diabetes, who was referred to me by Janifer Adie, MD for follow-up of chronic diarrhea.      11/04/2017 office visit describe 5 years of struggling with loose stools throughout the day.  Patient has never had a colonoscopy.  Stool studies including GI pathogen panel, O&P and fecal pancreatic elastase were normal.  Lomotil was prescribed 1 tab scheduled 3 times a day and it was recommended she hold her Cholestyramine and Imodium for now as she did not believe they were helping.  It was recommended that if above measures did not help she would need to be scheduled for a colonoscopy in the hospital due to her decreased EF and chronic anticoagulation.    Today, explains that using the Lomotil at first it helped to kind of slow things down but now she is back to her chronic loose stools multiple times a day.  Also continues with lower abdominal discomfort before bowel movement.    Denies weight loss, fever, chills or blood in her stool.  Past Medical History:  Diagnosis Date  . Anemia    Anemia of chronic disease  . Atrial fibrillation ([place])   . Cardiomyopathy ([place])    presumed non-ischemic due to no ischemia on stress 07/2010 [facility])  . CHF (congestive heart failure) ([place])   . Chronic kidney disease    T TH SAT [address]  . Diabetes mellitus    Type II  . DVT (deep venous thrombosis) ([facility]) approx 1999  . Hypertension   . Necrotizing fasciitis ([place]) 2012  . Pneumonia 2010  . Pulmonary embolus ([facility]) approx 1999  . Shortness of breath   . Thyroid disease    Secondary Hyperparathyroidism    Past Surgical History:  Procedure Laterality Date  . AV FISTULA PLACEMENT  02/10/2011   Left Brachiocephalic Arteriovenous Fistula Formation  . AV FISTULA PLACEMENT   12/10/2011   Procedure: INSERTION OF ARTERIOVENOUS (AV) GORE-TEX GRAFT ARM;  Surgeon: Angelia Mould, MD;  Location: [facility];  Service: Vascular;  Laterality: Right;  . AVF revision  07-23-2011   left  . BACK SURGERY  10-2010   pt. 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 CESAREAN SECTION    . FISTULOGRAM Left 07/05/2011   Procedure: FISTULOGRAM;  Surgeon: Angelia Mould, MD;  Location: [facility] CATH LAB;  Service: Cardiovascular;  Laterality: Left;  . IR AV DIALY SHUNT INTRO NEEDLE/INTRACATH INITIAL W/PTA/IMG RIGHT Right 11/19/2016  . IR AV DIALY SHUNT INTRO NEEDLE/INTRACATH INITIAL W/PTA/IMG RIGHT Right 03/14/2017  . IR AV DIALY SHUNT INTRO NEEDLE/INTRACATH INITIAL W/PTA/IMG RIGHT Right 05/27/2017  . IR AV DIALY SHUNT INTRO NEEDLE/INTRACATH INITIAL W/PTA/IMG RIGHT Right 10/31/2017  . IR GENERIC HISTORICAL Right 12/29/2015   IR AV DIALY SHUNT INTRO NEEDLE/INTRACATH INITIAL W/PTA/IMG RIGHT 12/29/2015 Arne Cleveland, MD [facility]  . IR GENERIC HISTORICAL  06/25/2016   IR US GUIDE VASC ACCESS RIGHT 06/25/2016 Greggory Keen, MD [facility]  . IR GENERIC HISTORICAL Right 06/25/2016   IR AV DIALY SHUNT INTRO NEEDLE/INTRACATH INITIAL W/PTA/IMG RIGHT 06/25/2016 Greggory Keen, MD [facility]  . IR GENERIC HISTORICAL Right 07/12/2016   IR AV DIALY SHUNT INTRO NEEDLE/INTRACATH INITIAL W/PTA/IMG RIGHT 07/12/2016 Sandi Mariscal, MD [facility]  . IR  US GUIDE VASC ACCESS RIGHT  05/27/2017  . IR US GUIDE VASC ACCESS RIGHT  10/31/2017  . SALIVARY GLAND SURGERY     stone removed    Current Outpatient Medications  Medication Sig Dispense Refill  . acetaminophen (TYLENOL) 500 MG tablet Take 500 mg by mouth every 4 (four) hours as needed for moderate pain or headache.     . calcium carbonate (TUMS - DOSED IN MG ELEMENTAL CALCIUM) 500 MG chewable tablet Chew 1 tablet by mouth 3 (three) times daily before meals. Also before snacks    . cetirizine (ZYRTEC) 5 MG tablet  Take 5 mg by mouth daily.    . cholestyramine (QUESTRAN) 4 g packet Take 4 g by mouth daily as needed.    . digoxin (LANOXIN) 0.125 MG tablet Take 125 mcg by mouth 3 (three) times a week. Monday, Wednesday, Friday    . diphenoxylate-atropine (LOMOTIL) 2.5-0.025 MG tablet Take 1 tablet by mouth 4 (four) times daily. 938 tablet 1  . folic acid-vitamin b complex-vitamin c-selenium-zinc (DIALYVITE) 3 MG TABS Take 1 tablet by mouth daily.    Marland Kitchen glipiZIDE (GLUCOTROL) 5 MG tablet Take 5 mg by mouth 2 (two) times daily.     . Hypromellose (NATURES TEARS OP) Apply 1 drop to eye 2 (two) times daily as needed (irritated eyes).     . Lidocaine-Prilocaine, Bulk, 2.5-2.5 % CREA Apply 1 application topically 3 (three) times a week.     . loperamide (IMODIUM A-D) 2 MG tablet Take 2 mg by mouth every 4 (four) hours as needed for diarrhea or loose stools.    . miconazole (MICOTIN) 2 % powder Apply 1 application topically as needed for itching.    . midodrine (PROAMATINE) 10 MG tablet Take 10 mg by mouth 2 (two) times daily.    . simethicone (MYLICON) 101 MG chewable tablet Chew 125 mg by mouth every 6 (six) hours as needed for flatulence.    . Skin Protectants, Misc. (EUCERIN) cream Apply topically as needed for dry skin.    Marland Kitchen warfarin (COUMADIN) 3 MG tablet Take 1.5-3 mg by mouth See admin instructions. Pt takes 1.5mg  on M, W, F - takes 3mg  on Tu, Thu, Sa, Su     No current facility-administered medications for this visit.     Allergies as of 11/18/2017 - Review Complete 11/04/2017  Allergen Reaction Noted  . Sulfa antibiotics Other (See Comments) 06/11/2011  . Oxycodone  12/17/2016    Family History  Problem Relation Age of Onset  . Cancer Mother 74  . Heart disease Father 23  . Cancer Sister   . Anesthesia problems Neg Hx   . Hypotension Neg Hx   . Malignant hyperthermia Neg Hx   . Pseudochol deficiency Neg Hx     Social History   Socioeconomic History  . Marital status: Legally Separated     Spouse name: Not on file  . Number of children: Not on file  . Years of education: Not on file  . Highest education level: Not on file  Occupational History  . Not on file  Social Needs  . Financial resource strain: Not on file  . Food insecurity:    Worry: Not on file    Inability: Not on file  . Transportation needs:    Medical: Not on file    Non-medical: Not on file  Tobacco Use  . Smoking status: Former Smoker    Years: 20.00    Types: Cigarettes    Last attempt  to quit: 05/31/2010    Years since quitting: 7.4  . Smokeless tobacco: Never Used  Substance and Sexual Activity  . Alcohol use: No    Alcohol/week: 0.0 oz  . Drug use: No  . Sexual activity: Not Currently    Birth control/protection: Post-menopausal  Lifestyle  . Physical activity:    Days per week: Not on file    Minutes per session: Not on file  . Stress: Not on file  Relationships  . Social connections:    Talks on phone: Not on file    Gets together: Not on file    Attends religious service: Not on file    Active member of club or organization: Not on file    Attends meetings of clubs or organizations: Not on file    Relationship status: Not on file  . Intimate partner violence:    Fear of current or ex partner: Not on file    Emotionally abused: Not on file    Physically abused: Not on file    Forced sexual activity: Not on file  Other Topics Concern  . Not on file  Social History Narrative  . Not on file    Review of Systems:    Constitutional: No weight loss, fever or chills Cardiovascular: No chest pain Respiratory: No SOB  Gastrointestinal: See HPI and otherwise negative   Physical Exam:  Vital signs: BP 100/62   Pulse 70   Ht 5\' 2"  (1.575 m)   Wt 163 lb 6.4 oz (74.1 kg)   SpO2 96%   BMI 29.89 kg/m    Constitutional:   Pleasant chronically ill appearing Caucasian female appears to be in NAD, Well developed, Well nourished, alert and cooperative Respiratory: Respirations even and  unlabored. Lungs clear to auscultation bilaterally.   No wheezes, crackles, or rhonchi.  Cardiovascular: Normal S1, S2. No MRG. Regular rate and rhythm. No peripheral edema, cyanosis or pallor.  Gastrointestinal:  Soft, nondistended, nontender. No rebound or guarding. Normal bowel sounds. No appreciable masses or hepatomegaly. 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  See HPI recent labs.  Assessment: 1.  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  Screening for colorectal cancer: She has never had a colonoscopy  Plan: 1.  Scheduled patient for a colonoscopy at the hospital.  Discussed risk, benefits, limitations and alternatives and the patient agrees to proceed. 2.  Recommend the patient discontinue Lomotil during prep 3. Will need to hold Coumadin for 5 days. We will discuss with her physician to ensure this is acceptable for her. 4.  Patient to follow in clinic per recommendations from Dr. Ardis Hughs after time of procedure.  Kelly Newer, PA-C [facility] 11/18/2017, 8:56 AM  Cc: Janifer Adie, MD

## 2017-11-18 NOTE — Patient Instructions (Signed)

## 2017-11-18 NOTE — Progress Notes (Signed)
I agree with the above note, plan 

## 2017-11-18 NOTE — Telephone Encounter (Signed)
Ellouise Newer, PA notified, results normal.

## 2017-12-15 ENCOUNTER — Other Ambulatory Visit: Payer: Self-pay

## 2017-12-15 ENCOUNTER — Ambulatory Visit (HOSPITAL_COMMUNITY): Payer: Medicare (Managed Care) | Admitting: Anesthesiology

## 2017-12-15 ENCOUNTER — Ambulatory Visit (HOSPITAL_COMMUNITY)
Admission: RE | Admit: 2017-12-15 | Discharge: 2017-12-15 | Disposition: A | Discharge status: Home / Self Care | Payer: Medicare (Managed Care) | Source: Ambulatory Visit | Attending: Gastroenterology | Admitting: Gastroenterology

## 2017-12-15 ENCOUNTER — Encounter (HOSPITAL_COMMUNITY)
Admission: RE | Disposition: A | Discharge status: Home / Self Care | Payer: Self-pay | Source: Ambulatory Visit | Attending: Gastroenterology

## 2017-12-15 ENCOUNTER — Encounter (HOSPITAL_COMMUNITY): Payer: Self-pay

## 2017-12-15 DIAGNOSIS — Z87891 Personal history of nicotine dependence: Secondary | ICD-10-CM | POA: Insufficient documentation

## 2017-12-15 DIAGNOSIS — N2581 Secondary hyperparathyroidism of renal origin: Secondary | ICD-10-CM | POA: Insufficient documentation

## 2017-12-15 DIAGNOSIS — Z7901 Long term (current) use of anticoagulants: Secondary | ICD-10-CM | POA: Diagnosis not present

## 2017-12-15 DIAGNOSIS — N189 Chronic kidney disease, unspecified: Secondary | ICD-10-CM | POA: Diagnosis not present

## 2017-12-15 DIAGNOSIS — Z8249 Family history of ischemic heart disease and other diseases of the circulatory system: Secondary | ICD-10-CM | POA: Diagnosis not present

## 2017-12-15 DIAGNOSIS — Z885 Allergy status to narcotic agent status: Secondary | ICD-10-CM | POA: Insufficient documentation

## 2017-12-15 DIAGNOSIS — Z7984 Long term (current) use of oral hypoglycemic drugs: Secondary | ICD-10-CM | POA: Insufficient documentation

## 2017-12-15 DIAGNOSIS — Z882 Allergy status to sulfonamides status: Secondary | ICD-10-CM | POA: Diagnosis not present

## 2017-12-15 DIAGNOSIS — D123 Benign neoplasm of transverse colon: Secondary | ICD-10-CM | POA: Diagnosis not present

## 2017-12-15 DIAGNOSIS — Z86711 Personal history of pulmonary embolism: Secondary | ICD-10-CM | POA: Insufficient documentation

## 2017-12-15 DIAGNOSIS — I130 Hypertensive heart and chronic kidney disease with heart failure and stage 1 through stage 4 chronic kidney disease, or unspecified chronic kidney disease: Secondary | ICD-10-CM | POA: Diagnosis not present

## 2017-12-15 DIAGNOSIS — E1122 Type 2 diabetes mellitus with diabetic chronic kidney disease: Secondary | ICD-10-CM | POA: Diagnosis not present

## 2017-12-15 DIAGNOSIS — Z79899 Other long term (current) drug therapy: Secondary | ICD-10-CM | POA: Diagnosis not present

## 2017-12-15 DIAGNOSIS — K5730 Diverticulosis of large intestine without perforation or abscess without bleeding: Secondary | ICD-10-CM | POA: Diagnosis not present

## 2017-12-15 DIAGNOSIS — K52832 Lymphocytic colitis: Secondary | ICD-10-CM | POA: Diagnosis not present

## 2017-12-15 DIAGNOSIS — I509 Heart failure, unspecified: Secondary | ICD-10-CM | POA: Diagnosis not present

## 2017-12-15 DIAGNOSIS — K529 Noninfective gastroenteritis and colitis, unspecified: Secondary | ICD-10-CM

## 2017-12-15 DIAGNOSIS — I429 Cardiomyopathy, unspecified: Secondary | ICD-10-CM | POA: Diagnosis not present

## 2017-12-15 DIAGNOSIS — Z86718 Personal history of other venous thrombosis and embolism: Secondary | ICD-10-CM | POA: Diagnosis not present

## 2017-12-15 DIAGNOSIS — Z992 Dependence on renal dialysis: Secondary | ICD-10-CM | POA: Diagnosis not present

## 2017-12-15 DIAGNOSIS — I4891 Unspecified atrial fibrillation: Secondary | ICD-10-CM | POA: Insufficient documentation

## 2017-12-15 DIAGNOSIS — K635 Polyp of colon: Secondary | ICD-10-CM

## 2017-12-15 HISTORY — PX: COLONOSCOPY WITH PROPOFOL: SHX5780

## 2017-12-15 HISTORY — PX: POLYPECTOMY: SHX5525

## 2017-12-15 HISTORY — PX: BIOPSY: SHX5522

## 2017-12-15 LAB — POCT I-STAT 4, (NA,K, GLUC, HGB,HCT)
Glucose, Bld: 102 mg/dL — ABNORMAL HIGH (ref 70–99)
HEMATOCRIT: 38 % (ref 36.0–46.0)
HEMOGLOBIN: 12.9 g/dL (ref 12.0–15.0)
Potassium: 3 mmol/L — ABNORMAL LOW (ref 3.5–5.1)
SODIUM: 139 mmol/L (ref 135–145)

## 2017-12-15 LAB — GLUCOSE, CAPILLARY: Glucose-Capillary: 98 mg/dL (ref 70–99)

## 2017-12-15 SURGERY — COLONOSCOPY WITH PROPOFOL
Anesthesia: Monitor Anesthesia Care

## 2017-12-15 MED ORDER — PROPOFOL 10 MG/ML IV BOLUS
INTRAVENOUS | Status: DC | PRN
  Administered 2017-12-15: 40 mg via INTRAVENOUS

## 2017-12-15 MED ORDER — PROPOFOL 10 MG/ML IV BOLUS
INTRAVENOUS | Status: AC
  Filled 2017-12-15: qty 40

## 2017-12-15 MED ORDER — PROPOFOL 10 MG/ML IV BOLUS
INTRAVENOUS | Status: AC
  Filled 2017-12-15: qty 20

## 2017-12-15 MED ORDER — PROPOFOL 500 MG/50ML IV EMUL
INTRAVENOUS | Status: DC | PRN
  Administered 2017-12-15: 100 ug/kg/min via INTRAVENOUS

## 2017-12-15 MED ORDER — SODIUM CHLORIDE 0.9 % IV SOLN
INTRAVENOUS | Status: DC
  Administered 2017-12-15: 07:00:00 via INTRAVENOUS

## 2017-12-15 MED ORDER — PROPOFOL 10 MG/ML IV BOLUS
INTRAVENOUS | Status: AC
Start: 2017-12-15 — End: ?
  Filled 2017-12-15: qty 60

## 2017-12-15 SURGICAL SUPPLY — 21 items

## 2017-12-15 NOTE — Op Note (Signed)
[facility] Patient Name: Kelly Gallegos Procedure Date: 12/15/2017 MRN: [id number] Attending MD: Milus Banister , MD Date of Birth: 1950-12-16 CSN: [id number] Age: 67 Admit Type: Outpatient Procedure:                Colonoscopy Indications:              Chronic diarrhea; infectious workup negative Providers:                Milus Banister, MD, Zenon Mayo, RN, William Dalton, Technician Referring MD:              Medicines:                Monitored Anesthesia Care Complications:            No immediate complications. Estimated blood loss:                            None. Estimated Blood Loss:     Estimated blood loss: none. Procedure:                Pre-Anesthesia Assessment:                           - Prior to the procedure, a History and Physical                            was performed, and patient medications and                            allergies were reviewed. The patient's tolerance of                            previous anesthesia was also reviewed. The risks                            and benefits of the procedure and the sedation                            options and risks were discussed with the patient.                            All questions were answered, and informed consent                            was obtained. Prior Anticoagulants: The patient has                            taken Coumadin (warfarin), last dose was 5 days                            prior to procedure. ASA Grade Assessment: IV - A  patient with severe systemic disease that is a                            constant threat to life. After reviewing the risks                            and benefits, the patient was deemed in                            satisfactory condition to undergo the procedure.                           After obtaining informed consent, the colonoscope                            was passed under direct  vision. Throughout the                            procedure, the patient's blood pressure, pulse, and                            oxygen saturations were monitored continuously. The                            EC-[id number] ([id number]) scope was introduced through                            the anus and advanced to the the terminal ileum.                            The colonoscopy was performed without difficulty.                            The patient tolerated the procedure well. The                            quality of the bowel preparation was good. The                            terminal ileum, ileocecal valve, appendiceal                            orifice, and rectum were photographed. Scope In: 8:24:29 AM Scope Out: 8:39:19 AM Scope Withdrawal Time: 0 hours 10 minutes 57 seconds  Total Procedure Duration: 0 hours 14 minutes 50 seconds  Findings:      The terminal ileum appeared normal.      Two sessile polyps were found in the transverse colon. The polyps were 2       to 3 mm in size. These polyps were removed with a cold snare. Resection       and retrieval were complete.      Multiple small and large-mouthed diverticula were found in the left       colon.      The exam was otherwise without abnormality.  Biopsies for histology were taken with a cold forceps from the entire       colon for evaluation of microscopic colitis. Impression:               - The examined portion of the ileum was normal.                           - Two 2 to 3 mm polyps in the transverse colon,                            removed with a cold snare. Resected and retrieved.                           - Diverticulosis in the left colon.                           - The examination was otherwise normal.                           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 Moderate Sedation:      N/A- Per Anesthesia  Care Recommendation:           - Patient has a contact number available for                            emergencies. The signs and symptoms of potential                            delayed complications were discussed with the                            patient. Return to normal activities tomorrow.                            Written discharge instructions were provided to the                            patient.                           - Resume previous diet.                           - Continue present medications. Please change your                            lomotil dosing so that you take 2 pills every AM                            shortly after waking and then two more pills about  6 hours later.                           - Await pathology results. If no clear cause of                            chronic diarrhea, will check Celiac Sprue                            serologies and then antibiotic trial for possible                            SIBO. Procedure Code(s):        --- Professional ---                           [phone], Colonoscopy, flexible; with removal of                            tumor(s), polyp(s), or other lesion(s) by snare                            technique                           45380, 42, Colonoscopy, flexible; with biopsy,                            single or multiple Diagnosis Code(s):        --- Professional ---                           D12.3, Benign neoplasm of transverse colon (hepatic                            flexure or splenic flexure)                           K52.9, Noninfective gastroenteritis and colitis,                            unspecified                           K57.30, Diverticulosis of large intestine without                            perforation or abscess without bleeding CPT copyright 2017 [employer]. All rights reserved. The codes documented in this report are preliminary and upon coder  review may  be revised to meet current compliance requirements. Milus Banister, MD 12/15/2017 8:43:24 AM This report has been signed electronically. Number of Addenda: 0

## 2017-12-15 NOTE — Interval H&P Note (Signed)
History and Physical Interval Note:  12/15/2017 7:22 AM  Kelly Gallegos  has presented today for surgery, with the diagnosis of chronic diarrhea/db  The various methods of treatment have been discussed with the patient and family. After consideration of risks, benefits and other options for treatment, the patient has consented to  Procedure(s): COLONOSCOPY WITH PROPOFOL (N/A) as a surgical intervention .  The patient's history has been reviewed, patient examined, no change in status, stable for surgery.  I have reviewed the patient's chart and labs.  Questions were answered to the patient's satisfaction.     Milus Banister

## 2017-12-15 NOTE — Transfer of Care (Signed)
Immediate Anesthesia Transfer of Care Note  Patient: Kelly Gallegos  Procedure(s) Performed: COLONOSCOPY WITH PROPOFOL (N/A ) BIOPSY POLYPECTOMY  Patient Location: PACU  Anesthesia Type:MAC  Level of Consciousness: sedated, patient cooperative and responds to stimulation  Airway & Oxygen Therapy: Patient Spontanous Breathing and Patient connected to face mask oxygen  Post-op Assessment: Report given to RN and Post -op Vital signs reviewed and stable  Post vital signs: Reviewed and stable  Last Vitals:  Vitals Value Taken Time  BP    Temp 36.4 C 12/15/2017  8:45 AM  Pulse 62 12/15/2017  8:45 AM  Resp 17 12/15/2017  8:45 AM  SpO2      Last Pain:  Vitals:   12/15/17 0845  TempSrc: Oral  PainSc:          Complications: No apparent anesthesia complications

## 2017-12-15 NOTE — Discharge Instructions (Signed)
°  °  This information is not intended to replace advice given to you by your health care provider. Make sure you discuss any questions you have with your health care provider. Document Released: 11/10/2000 Document Revised: 04/30/2016 Document Reviewed: 12/17/2013 Elsevier Interactive Patient Education  2018 [place] HAD AN ENDOSCOPIC PROCEDURE TODAY: Refer to the procedure report and other information in the discharge instructions given to you for any specific questions about what was found during the examination. If this information does not answer your questions, please call [facility] office at [phone] to clarify.   YOU SHOULD EXPECT: Some feelings of bloating in the abdomen. Passage of more gas than usual. Walking can help get rid of the air that was put into your GI tract during the procedure and reduce the bloating. If you had a lower endoscopy (such as a colonoscopy or flexible sigmoidoscopy) you may notice spotting of blood in your stool or on the toilet paper. Some abdominal soreness may be present for a day or two, also.  DIET: Your first meal following the procedure should be a light meal and then it is ok to progress to your normal diet. A half-sandwich or bowl of soup is an example of a good first meal. Heavy or fried foods are harder to digest and may make you feel nauseous or bloated. Drink plenty of fluids but you should avoid alcoholic beverages for 24 hours. If you had a esophageal dilation, please see attached instructions for diet.    ACTIVITY: Your care partner should take you home directly after the procedure. You should plan to take it easy, moving slowly for the rest of the day. You can resume normal activity the day after the procedure however YOU SHOULD NOT DRIVE, use power tools, machinery or perform tasks that involve climbing or major physical exertion for 24 hours (because of the sedation medicines used during the test).   SYMPTOMS TO REPORT IMMEDIATELY: A  gastroenterologist can be reached at any hour. Please call [phone]  for any of the following symptoms:  Following lower endoscopy (colonoscopy, flexible sigmoidoscopy) Excessive amounts of blood in the stool  Significant tenderness, worsening of abdominal pains  Swelling of the abdomen that is new, acute  Fever of 100 or higher   FOLLOW UP:  If any biopsies were taken you will be contacted by phone or by letter within the next 1-3 weeks. Call [phone]  if you have not heard about the biopsies in 3 weeks.  Please also call with any specific questions about appointments or follow up tests.

## 2017-12-15 NOTE — Anesthesia Preprocedure Evaluation (Signed)
Anesthesia Evaluation  Patient identified by MRN, date of birth, ID band Patient awake    Reviewed: Allergy & Precautions, H&P , NPO status , Patient's Chart, lab work & pertinent test results  Airway Mallampati: II   Neck ROM: full    Dental   Pulmonary shortness of breath, former smoker,    breath sounds clear to auscultation       Cardiovascular hypertension, +CHF and + DVT  + dysrhythmias Atrial Fibrillation  Rhythm:regular Rate:Normal  Stress test (01/2017): no ischemia   Neuro/Psych    GI/Hepatic   Endo/Other  diabetes, Type 2  Renal/GU ESRFRenal disease     Musculoskeletal   Abdominal   Peds  Hematology  (+) anemia ,   Anesthesia Other Findings   Reproductive/Obstetrics                             Anesthesia Physical Anesthesia Plan  ASA: III  Anesthesia Plan: MAC   Post-op Pain Management:    Induction: Intravenous  PONV Risk Score and Plan: 2 and Ondansetron, Propofol infusion and Treatment may vary due to age or medical condition  Airway Management Planned: Nasal Cannula  Additional Equipment:   Intra-op Plan:   Post-operative Plan:   Informed Consent: I have reviewed the patients History and Physical, chart, labs and discussed the procedure including the risks, benefits and alternatives for the proposed anesthesia with the patient or authorized representative who has indicated his/her understanding and acceptance.     Plan Discussed with: CRNA, Anesthesiologist and Surgeon  Anesthesia Plan Comments:         Anesthesia Quick Evaluation

## 2017-12-15 NOTE — Anesthesia Postprocedure Evaluation (Signed)
Anesthesia Post Note  Patient: Kelly Gallegos  Procedure(s) Performed: COLONOSCOPY WITH PROPOFOL (N/A ) BIOPSY POLYPECTOMY     Patient location during evaluation: PACU Anesthesia Type: MAC Level of consciousness: awake and alert Pain management: pain level controlled Vital Signs Assessment: post-procedure vital signs reviewed and stable Respiratory status: spontaneous breathing, nonlabored ventilation, respiratory function stable and patient connected to nasal cannula oxygen Cardiovascular status: stable and blood pressure returned to baseline Postop Assessment: no apparent nausea or vomiting Anesthetic complications: no    Last Vitals:  Vitals:   12/15/17 0905 12/15/17 0909  BP: (!) 98/37 (!) 111/43  Pulse: 71 (!) 57  Resp: (!) 22 (!) 21  Temp:    SpO2: 96% 100%    Last Pain:  Vitals:   12/15/17 0909  TempSrc:   PainSc: 0-No pain                 Deannah Rossi S

## 2017-12-15 NOTE — Progress Notes (Signed)
3.0 potassium reported to Dr. Marcie Bal.  Okay to proceed.

## 2017-12-16 ENCOUNTER — Encounter (HOSPITAL_COMMUNITY): Payer: Self-pay | Admitting: Gastroenterology

## 2017-12-22 ENCOUNTER — Other Ambulatory Visit: Payer: Self-pay

## 2017-12-22 DIAGNOSIS — K529 Noninfective gastroenteritis and colitis, unspecified: Secondary | ICD-10-CM

## 2017-12-29 DEATH — deceased
